# Patient Record
Sex: Female | Born: 1960 | Race: Black or African American | Hispanic: No | Marital: Married | Smoking: Never smoker
Health system: Southern US, Community
[De-identification: ages and names within clinical notes are randomized; demographics above are authoritative.]

## PROBLEM LIST (undated history)

## (undated) DIAGNOSIS — H269 Unspecified cataract: Secondary | ICD-10-CM

## (undated) DIAGNOSIS — F419 Anxiety disorder, unspecified: Secondary | ICD-10-CM

## (undated) DIAGNOSIS — C801 Malignant (primary) neoplasm, unspecified: Secondary | ICD-10-CM

## (undated) DIAGNOSIS — I10 Essential (primary) hypertension: Secondary | ICD-10-CM

## (undated) DIAGNOSIS — K219 Gastro-esophageal reflux disease without esophagitis: Secondary | ICD-10-CM

## (undated) DIAGNOSIS — T7840XA Allergy, unspecified, initial encounter: Secondary | ICD-10-CM

## (undated) DIAGNOSIS — Z8639 Personal history of other endocrine, nutritional and metabolic disease: Secondary | ICD-10-CM

## (undated) DIAGNOSIS — R7303 Prediabetes: Secondary | ICD-10-CM

## (undated) DIAGNOSIS — D649 Anemia, unspecified: Secondary | ICD-10-CM

## (undated) HISTORY — DX: Gastro-esophageal reflux disease without esophagitis: K21.9

## (undated) HISTORY — DX: Allergy, unspecified, initial encounter: T78.40XA

## (undated) HISTORY — DX: Anxiety disorder, unspecified: F41.9

## (undated) HISTORY — DX: Unspecified cataract: H26.9

## (undated) HISTORY — DX: Personal history of other endocrine, nutritional and metabolic disease: Z86.39

---

## 1998-05-17 ENCOUNTER — Emergency Department (HOSPITAL_COMMUNITY): Admission: EM | Admit: 1998-05-17 | Discharge: 1998-05-17 | Payer: Self-pay | Admitting: Emergency Medicine

## 1998-06-06 ENCOUNTER — Emergency Department (HOSPITAL_COMMUNITY): Admission: EM | Admit: 1998-06-06 | Discharge: 1998-06-06 | Payer: Self-pay | Admitting: Emergency Medicine

## 1998-06-08 ENCOUNTER — Other Ambulatory Visit: Admission: RE | Admit: 1998-06-08 | Discharge: 1998-06-08 | Payer: Self-pay | Admitting: *Deleted

## 1999-05-03 ENCOUNTER — Emergency Department (HOSPITAL_COMMUNITY): Admission: EM | Admit: 1999-05-03 | Discharge: 1999-05-03 | Payer: Self-pay | Admitting: Emergency Medicine

## 1999-07-18 ENCOUNTER — Ambulatory Visit (HOSPITAL_COMMUNITY): Admission: RE | Admit: 1999-07-18 | Discharge: 1999-07-18 | Payer: Self-pay | Admitting: Internal Medicine

## 2000-11-09 ENCOUNTER — Inpatient Hospital Stay (HOSPITAL_COMMUNITY): Admission: AD | Admit: 2000-11-09 | Discharge: 2000-11-09 | Payer: Self-pay | Admitting: Obstetrics & Gynecology

## 2001-03-27 ENCOUNTER — Ambulatory Visit (HOSPITAL_COMMUNITY): Admission: RE | Admit: 2001-03-27 | Discharge: 2001-03-27 | Payer: Self-pay

## 2001-05-02 ENCOUNTER — Encounter: Admission: RE | Admit: 2001-05-02 | Discharge: 2001-05-02 | Payer: Self-pay | Admitting: Hematology and Oncology

## 2001-06-11 ENCOUNTER — Emergency Department (HOSPITAL_COMMUNITY): Admission: EM | Admit: 2001-06-11 | Discharge: 2001-06-11 | Payer: Self-pay | Admitting: Emergency Medicine

## 2001-12-27 ENCOUNTER — Emergency Department (HOSPITAL_COMMUNITY): Admission: EM | Admit: 2001-12-27 | Discharge: 2001-12-28 | Payer: Self-pay | Admitting: Emergency Medicine

## 2001-12-28 ENCOUNTER — Emergency Department (HOSPITAL_COMMUNITY): Admission: EM | Admit: 2001-12-28 | Discharge: 2001-12-28 | Payer: Self-pay | Admitting: Emergency Medicine

## 2002-04-21 ENCOUNTER — Encounter: Admission: RE | Admit: 2002-04-21 | Discharge: 2002-04-21 | Payer: Self-pay

## 2002-12-03 ENCOUNTER — Encounter: Admission: RE | Admit: 2002-12-03 | Discharge: 2002-12-03 | Payer: Self-pay | Admitting: Internal Medicine

## 2002-12-09 ENCOUNTER — Ambulatory Visit (HOSPITAL_COMMUNITY): Admission: RE | Admit: 2002-12-09 | Discharge: 2002-12-09 | Payer: Self-pay | Admitting: Internal Medicine

## 2004-01-29 ENCOUNTER — Ambulatory Visit (HOSPITAL_COMMUNITY): Admission: RE | Admit: 2004-01-29 | Discharge: 2004-01-29 | Payer: Self-pay | Admitting: Internal Medicine

## 2004-04-17 ENCOUNTER — Emergency Department (HOSPITAL_COMMUNITY): Admission: EM | Admit: 2004-04-17 | Discharge: 2004-04-17 | Payer: Self-pay | Admitting: Emergency Medicine

## 2004-05-31 ENCOUNTER — Encounter: Admission: RE | Admit: 2004-05-31 | Discharge: 2004-05-31 | Payer: Self-pay | Admitting: Internal Medicine

## 2005-08-10 ENCOUNTER — Ambulatory Visit (HOSPITAL_COMMUNITY): Admission: RE | Admit: 2005-08-10 | Discharge: 2005-08-10 | Payer: Self-pay | Admitting: Internal Medicine

## 2005-08-14 ENCOUNTER — Ambulatory Visit: Payer: Self-pay | Admitting: Internal Medicine

## 2005-08-14 ENCOUNTER — Emergency Department (HOSPITAL_COMMUNITY): Admission: EM | Admit: 2005-08-14 | Discharge: 2005-08-14 | Payer: Self-pay | Admitting: Family Medicine

## 2005-10-26 ENCOUNTER — Ambulatory Visit: Payer: Self-pay | Admitting: Internal Medicine

## 2005-11-15 ENCOUNTER — Ambulatory Visit: Payer: Self-pay | Admitting: Internal Medicine

## 2005-12-06 ENCOUNTER — Ambulatory Visit: Payer: Self-pay | Admitting: Internal Medicine

## 2005-12-13 ENCOUNTER — Ambulatory Visit: Payer: Self-pay | Admitting: Internal Medicine

## 2006-07-04 ENCOUNTER — Ambulatory Visit: Payer: Self-pay | Admitting: Internal Medicine

## 2006-10-11 ENCOUNTER — Encounter (INDEPENDENT_AMBULATORY_CARE_PROVIDER_SITE_OTHER): Payer: Self-pay | Admitting: Internal Medicine

## 2006-10-11 ENCOUNTER — Ambulatory Visit: Payer: Self-pay | Admitting: Internal Medicine

## 2006-10-11 LAB — CONVERTED CEMR LAB
AST: 12 units/L (ref 0–37)
Albumin: 3.7 g/dL (ref 3.5–5.2)
Alkaline Phosphatase: 60 units/L (ref 39–117)
BUN: 11 mg/dL (ref 6–23)
CO2: 25 meq/L (ref 19–32)
Calcium: 9.1 mg/dL (ref 8.4–10.5)
Chloride: 107 meq/L (ref 96–112)
Creatinine, Ser: 0.88 mg/dL (ref 0.40–1.20)

## 2006-10-12 ENCOUNTER — Ambulatory Visit (HOSPITAL_COMMUNITY): Admission: RE | Admit: 2006-10-12 | Discharge: 2006-10-12 | Payer: Self-pay | Admitting: Internal Medicine

## 2006-11-21 ENCOUNTER — Ambulatory Visit: Payer: Self-pay | Admitting: Hospitalist

## 2007-06-25 ENCOUNTER — Ambulatory Visit: Payer: Self-pay | Admitting: Internal Medicine

## 2007-06-25 ENCOUNTER — Encounter (INDEPENDENT_AMBULATORY_CARE_PROVIDER_SITE_OTHER): Payer: Self-pay | Admitting: *Deleted

## 2007-06-25 DIAGNOSIS — I10 Essential (primary) hypertension: Secondary | ICD-10-CM

## 2007-06-25 DIAGNOSIS — K219 Gastro-esophageal reflux disease without esophagitis: Secondary | ICD-10-CM | POA: Insufficient documentation

## 2007-06-25 DIAGNOSIS — R609 Edema, unspecified: Secondary | ICD-10-CM | POA: Insufficient documentation

## 2007-06-25 DIAGNOSIS — E669 Obesity, unspecified: Secondary | ICD-10-CM

## 2007-06-25 DIAGNOSIS — K921 Melena: Secondary | ICD-10-CM | POA: Insufficient documentation

## 2007-06-25 DIAGNOSIS — L259 Unspecified contact dermatitis, unspecified cause: Secondary | ICD-10-CM

## 2007-06-25 HISTORY — DX: Melena: K92.1

## 2007-06-25 LAB — CONVERTED CEMR LAB
Albumin: 4 g/dL (ref 3.5–5.2)
Alkaline Phosphatase: 51 units/L (ref 39–117)
Chloride: 105 meq/L (ref 96–112)
Potassium: 3.9 meq/L (ref 3.5–5.3)
Sodium: 140 meq/L (ref 135–145)
TSH: 2.055 microintl units/mL (ref 0.350–5.50)
Total Protein: 7.3 g/dL (ref 6.0–8.3)

## 2007-07-05 ENCOUNTER — Ambulatory Visit: Payer: Self-pay | Admitting: Internal Medicine

## 2007-07-05 ENCOUNTER — Encounter (INDEPENDENT_AMBULATORY_CARE_PROVIDER_SITE_OTHER): Payer: Self-pay | Admitting: *Deleted

## 2007-07-05 LAB — CONVERTED CEMR LAB
HDL: 36 mg/dL — ABNORMAL LOW (ref >39)
Total CHOL/HDL Ratio: 4.8
VLDL: 27 mg/dL (ref 0–40)

## 2007-08-08 ENCOUNTER — Emergency Department (HOSPITAL_COMMUNITY): Admission: EM | Admit: 2007-08-08 | Discharge: 2007-08-08 | Payer: Self-pay | Admitting: Emergency Medicine

## 2007-10-15 ENCOUNTER — Ambulatory Visit (HOSPITAL_COMMUNITY): Admission: RE | Admit: 2007-10-15 | Discharge: 2007-10-15 | Payer: Self-pay | Admitting: Obstetrics & Gynecology

## 2007-11-05 ENCOUNTER — Ambulatory Visit: Payer: Self-pay | Admitting: Family Medicine

## 2007-11-05 ENCOUNTER — Encounter (INDEPENDENT_AMBULATORY_CARE_PROVIDER_SITE_OTHER): Payer: Self-pay | Admitting: Nurse Practitioner

## 2007-11-05 LAB — CONVERTED CEMR LAB
Alkaline Phosphatase: 58 units/L (ref 39–117)
BUN: 10 mg/dL (ref 6–23)
Basophils Absolute: 0 10*3/uL (ref 0.0–0.1)
Basophils Relative: 0 % (ref 0–1)
Calcium: 8.8 mg/dL (ref 8.4–10.5)
Chloride: 103 meq/L (ref 96–112)
Creatinine, Ser: 0.72 mg/dL (ref 0.40–1.20)
Eosinophils Relative: 10 % — ABNORMAL HIGH (ref 0–5)
Glucose, Bld: 101 mg/dL — ABNORMAL HIGH (ref 70–99)
HDL: 39 mg/dL — ABNORMAL LOW (ref >39)
LDL Cholesterol: 124 mg/dL — ABNORMAL HIGH (ref 0–99)
Lymphocytes Relative: 32 % (ref 12–46)
Lymphs Abs: 1.6 10*3/uL (ref 0.7–4.0)
MCV: 87.5 fL (ref 78.0–100.0)
Neutro Abs: 2.4 10*3/uL (ref 1.7–7.7)
Neutrophils Relative %: 51 % (ref 43–77)
Platelets: 352 10*3/uL (ref 150–400)
RDW: 14.9 % (ref 11.5–15.5)
Total Bilirubin: 0.3 mg/dL (ref 0.3–1.2)
Total CHOL/HDL Ratio: 5.1
Total Protein: 7.2 g/dL (ref 6.0–8.3)
WBC: 4.8 10*3/uL (ref 4.0–10.5)

## 2007-11-06 ENCOUNTER — Ambulatory Visit: Payer: Self-pay | Admitting: *Deleted

## 2007-12-04 ENCOUNTER — Encounter (INDEPENDENT_AMBULATORY_CARE_PROVIDER_SITE_OTHER): Payer: Self-pay | Admitting: Nurse Practitioner

## 2007-12-04 ENCOUNTER — Ambulatory Visit: Payer: Self-pay | Admitting: Family Medicine

## 2007-12-27 ENCOUNTER — Emergency Department (HOSPITAL_COMMUNITY): Admission: EM | Admit: 2007-12-27 | Discharge: 2007-12-27 | Payer: Self-pay | Admitting: Emergency Medicine

## 2008-02-12 ENCOUNTER — Ambulatory Visit: Payer: Self-pay | Admitting: Family Medicine

## 2008-08-15 ENCOUNTER — Emergency Department (HOSPITAL_COMMUNITY): Admission: EM | Admit: 2008-08-15 | Discharge: 2008-08-15 | Payer: Self-pay | Admitting: Emergency Medicine

## 2010-01-05 ENCOUNTER — Emergency Department (HOSPITAL_COMMUNITY): Admission: EM | Admit: 2010-01-05 | Discharge: 2010-01-05 | Payer: Self-pay | Admitting: Emergency Medicine

## 2010-08-02 ENCOUNTER — Emergency Department (HOSPITAL_COMMUNITY): Admission: EM | Admit: 2010-08-02 | Discharge: 2010-08-02 | Payer: Self-pay | Admitting: Family Medicine

## 2010-10-16 ENCOUNTER — Emergency Department (HOSPITAL_COMMUNITY): Admission: EM | Admit: 2010-10-16 | Discharge: 2010-10-16 | Payer: Self-pay | Admitting: Emergency Medicine

## 2010-12-01 ENCOUNTER — Emergency Department (HOSPITAL_COMMUNITY): Admission: EM | Admit: 2010-12-01 | Discharge: 2010-08-04 | Payer: Self-pay | Admitting: Emergency Medicine

## 2011-01-15 ENCOUNTER — Encounter: Payer: Self-pay | Admitting: Family Medicine

## 2011-02-01 ENCOUNTER — Other Ambulatory Visit: Payer: Self-pay

## 2011-03-11 LAB — POCT I-STAT, CHEM 8
BUN: 13 mg/dL (ref 6–23)
Creatinine, Ser: 0.6 mg/dL (ref 0.4–1.2)
Glucose, Bld: 95 mg/dL (ref 70–99)
HCT: 41 % (ref 36.0–46.0)
Sodium: 138 mEq/L (ref 135–145)

## 2011-03-31 ENCOUNTER — Other Ambulatory Visit (HOSPITAL_COMMUNITY): Payer: Self-pay | Admitting: Family Medicine

## 2011-03-31 DIAGNOSIS — Z1231 Encounter for screening mammogram for malignant neoplasm of breast: Secondary | ICD-10-CM

## 2011-04-05 ENCOUNTER — Ambulatory Visit (HOSPITAL_COMMUNITY)
Admission: RE | Admit: 2011-04-05 | Discharge: 2011-04-05 | Disposition: A | Discharge status: Home / Self Care | Payer: Self-pay | Source: Ambulatory Visit | Attending: Family Medicine | Admitting: Family Medicine

## 2011-04-05 DIAGNOSIS — Z1231 Encounter for screening mammogram for malignant neoplasm of breast: Secondary | ICD-10-CM

## 2011-04-06 ENCOUNTER — Other Ambulatory Visit: Payer: Self-pay | Admitting: Family Medicine

## 2011-04-06 DIAGNOSIS — R928 Other abnormal and inconclusive findings on diagnostic imaging of breast: Secondary | ICD-10-CM

## 2011-04-11 ENCOUNTER — Ambulatory Visit
Admission: RE | Admit: 2011-04-11 | Discharge: 2011-04-11 | Disposition: A | Discharge status: Home / Self Care | Payer: Self-pay | Source: Ambulatory Visit | Attending: Family Medicine | Admitting: Family Medicine

## 2011-04-11 DIAGNOSIS — R928 Other abnormal and inconclusive findings on diagnostic imaging of breast: Secondary | ICD-10-CM

## 2011-05-17 ENCOUNTER — Emergency Department (HOSPITAL_BASED_OUTPATIENT_CLINIC_OR_DEPARTMENT_OTHER)
Admission: EM | Admit: 2011-05-17 | Discharge: 2011-05-17 | Disposition: A | Discharge status: Home / Self Care | Payer: Self-pay | Attending: Emergency Medicine | Admitting: Emergency Medicine

## 2011-05-17 DIAGNOSIS — I10 Essential (primary) hypertension: Secondary | ICD-10-CM | POA: Insufficient documentation

## 2011-05-17 DIAGNOSIS — J029 Acute pharyngitis, unspecified: Secondary | ICD-10-CM | POA: Insufficient documentation

## 2011-11-22 ENCOUNTER — Encounter (HOSPITAL_BASED_OUTPATIENT_CLINIC_OR_DEPARTMENT_OTHER): Payer: Self-pay | Admitting: *Deleted

## 2011-11-22 ENCOUNTER — Emergency Department (HOSPITAL_BASED_OUTPATIENT_CLINIC_OR_DEPARTMENT_OTHER)
Admission: EM | Admit: 2011-11-22 | Discharge: 2011-11-23 | Disposition: A | Discharge status: Home / Self Care | Payer: Self-pay | Attending: Emergency Medicine | Admitting: Emergency Medicine

## 2011-11-22 ENCOUNTER — Emergency Department (INDEPENDENT_AMBULATORY_CARE_PROVIDER_SITE_OTHER): Payer: Self-pay

## 2011-11-22 DIAGNOSIS — J40 Bronchitis, not specified as acute or chronic: Secondary | ICD-10-CM | POA: Insufficient documentation

## 2011-11-22 DIAGNOSIS — J984 Other disorders of lung: Secondary | ICD-10-CM | POA: Insufficient documentation

## 2011-11-22 DIAGNOSIS — R911 Solitary pulmonary nodule: Secondary | ICD-10-CM

## 2011-11-22 DIAGNOSIS — I10 Essential (primary) hypertension: Secondary | ICD-10-CM | POA: Insufficient documentation

## 2011-11-22 DIAGNOSIS — R05 Cough: Secondary | ICD-10-CM

## 2011-11-22 DIAGNOSIS — R079 Chest pain, unspecified: Secondary | ICD-10-CM

## 2011-11-22 HISTORY — DX: Essential (primary) hypertension: I10

## 2011-11-22 MED ORDER — PREDNISONE 50 MG PO TABS
60.0000 mg | ORAL_TABLET | Freq: Once | ORAL | Status: AC
  Administered 2011-11-22: 60 mg via ORAL
  Filled 2011-11-22: qty 1

## 2011-11-22 MED ORDER — ALBUTEROL SULFATE (5 MG/ML) 0.5% IN NEBU
2.5000 mg | INHALATION_SOLUTION | Freq: Once | RESPIRATORY_TRACT | Status: AC
  Administered 2011-11-22: 2.5 mg via RESPIRATORY_TRACT
  Filled 2011-11-22: qty 0.5

## 2011-11-22 MED ORDER — IPRATROPIUM BROMIDE 0.02 % IN SOLN
0.5000 mg | Freq: Once | RESPIRATORY_TRACT | Status: AC
  Administered 2011-11-22: 0.5 mg via RESPIRATORY_TRACT
  Filled 2011-11-22: qty 2.5

## 2011-11-22 MED ORDER — PREDNISONE 10 MG PO TABS: 20.0000 mg | ORAL_TABLET | Freq: Every day | ORAL | Status: AC

## 2011-11-22 MED ORDER — ALBUTEROL SULFATE HFA 108 (90 BASE) MCG/ACT IN AERS
2.0000 | INHALATION_SPRAY | RESPIRATORY_TRACT | Status: DC | PRN
  Administered 2011-11-22: 2 via RESPIRATORY_TRACT
  Filled 2011-11-22: qty 6.7

## 2011-11-22 NOTE — ED Notes (Signed)
Pt c/o cough productive of light brown sputum x1 week.

## 2011-11-22 NOTE — ED Provider Notes (Signed)
History     CSN: [id number] Arrival date & time: 11/22/2011  9:36 PM   First MD Initiated Contact with Patient 11/22/11 2309      Chief Complaint  Patient presents with  . Cough    (Consider location/radiation/quality/duration/timing/severity/associated sxs/prior treatment) Patient is a 50 y.o. female presenting with cough. The history is provided by the patient.  Cough   cough has been present for one week and is productive of some light tan sputum. There is some associated dyspnea. She denies any chest pain, heaviness, tightness, or pressure. She denies fever, chills, sweats. She denies rhinorrhea, sore throat, arthralgias, myalgias, diarrhea. She has tried over-the-counter measures such as NyQuil with no relief. Nothing makes her cough better nothing makes it worse. Symptoms are described as moderate. Her daughter states that cough seems to be worse when she gets out in cold air.  Past Medical History  Diagnosis Date  . Hypertension     Past Surgical History  Procedure Date  . Cesarean section     No family history on file.  History  Substance Use Topics  . Smoking status: Never Smoker   . Smokeless tobacco: Not on file  . Alcohol Use: No    OB History    Grav Para Term Preterm Abortions TAB SAB Ect Mult Living                  Review of Systems  Respiratory: Positive for cough.   All other systems reviewed and are negative.    Allergies  Penicillins  Home Medications   Current Outpatient Rx  Name Route Sig Dispense Refill  . AMLODIPINE BESYLATE 10 MG PO TABS Oral Take 10 mg by mouth daily.      [name] DIPHENHYDRAMINE-APAP (SLEEP) 25-500 MG PO TABS Oral Take 2 tablets by mouth at bedtime as needed. For sleep     . LISINOPRIL 40 MG PO TABS Oral Take 40 mg by mouth daily.      [name] ONE-DAILY MULTI VITAMINS PO TABS Oral Take 1 tablet by mouth daily.      [name] PRESCRIPTION MEDICATION Oral Take 1 tablet by mouth daily. Unknown blood pressure medication       BP  183/89  Pulse 66  Temp(Src) 98.6 F (37 C) (Oral)  Resp 20  SpO2 100%  LMP 02/24/2011  Physical Exam  Nursing note and vitals reviewed.  50 year old female resting comfortably and in no acute distress. Blood pressure is 183/8089. Head is normocephalic and atraumatic. PERRLA, EOMI. There's no sinus tenderness. Oropharynx is clear. Neck is supple without adenopathy or JVD. Back is nontender to CVA tenderness. Lungs have a slightly prolonged exhalation phase but no overt rales, wheezes, or rhonchi. Heart has regular rate and rhythm without murmur. Abdomen is soft, flat, nontender without masses or hepatosplenomegaly. Extremities have 1+ edema, no cyanosis. Skin is warm and dry without rash. Full range of motion is present. Neurologic: Mental status is normal, cranial nerves are intact, there are no motor or sensory deficits. Psychiatric: No abnormalities of mood or affect.  ED Course  Procedures (including critical care time)  Labs Reviewed - No data to display Dg Chest 2 View  11/22/2011  *RADIOLOGY REPORT*  Clinical Data: Productive cough.  CHEST - 2 VIEW  Comparison: 08/08/2007  Findings: Two views of the chest were obtained. There is a nodular density near the anterior left first rib which is asymmetric.  This could be related to the bone but indeterminate.  Heart size is within  normal limits.  No evidence for edema or airspace disease. No evidence of pleural effusions.  IMPRESSION:  Question a nodular density in the left upper lung.  Recommend further evaluation with a chest CT to exclude a parenchymal nodule.  These results were called by telephone on 11/22/2011  at  8:59 p.m. to  Dr. Ignacia Palma, who verbally acknowledged these results.  Original Report Authenticated By: Richarda Overlie, M.D.     No diagnosis found.   Reexamination at 11:45 PM: There has been significant improvement after an albuterol nebulizer treatment. Lungs are clear there is no prolonged exhalation phase. She feels a  tightness in her chest is significantly improved. MDM  Episode of bronchitis        Dione Booze, MD 11/22/11 2350

## 2012-01-17 ENCOUNTER — Ambulatory Visit (HOSPITAL_COMMUNITY)
Admission: RE | Admit: 2012-01-17 | Discharge: 2012-01-17 | Disposition: A | Discharge status: Home / Self Care | Payer: Self-pay | Source: Ambulatory Visit | Attending: Internal Medicine | Admitting: Internal Medicine

## 2012-01-17 ENCOUNTER — Other Ambulatory Visit: Payer: Self-pay | Admitting: Internal Medicine

## 2012-01-17 DIAGNOSIS — I10 Essential (primary) hypertension: Secondary | ICD-10-CM | POA: Insufficient documentation

## 2012-01-17 DIAGNOSIS — J988 Other specified respiratory disorders: Secondary | ICD-10-CM | POA: Insufficient documentation

## 2012-01-22 ENCOUNTER — Other Ambulatory Visit (HOSPITAL_COMMUNITY): Payer: Self-pay | Admitting: Internal Medicine

## 2012-01-22 DIAGNOSIS — R911 Solitary pulmonary nodule: Secondary | ICD-10-CM

## 2012-01-25 ENCOUNTER — Encounter (HOSPITAL_COMMUNITY): Payer: Self-pay

## 2012-01-25 ENCOUNTER — Ambulatory Visit (HOSPITAL_COMMUNITY)
Admission: RE | Admit: 2012-01-25 | Discharge: 2012-01-25 | Disposition: A | Discharge status: Home / Self Care | Payer: Self-pay | Source: Ambulatory Visit | Attending: Internal Medicine | Admitting: Internal Medicine

## 2012-01-25 DIAGNOSIS — R911 Solitary pulmonary nodule: Secondary | ICD-10-CM | POA: Insufficient documentation

## 2012-01-25 MED ORDER — IOHEXOL 300 MG/ML  SOLN: 80.0000 mL | Freq: Once | INTRAMUSCULAR | Status: AC | PRN

## 2012-08-06 ENCOUNTER — Other Ambulatory Visit (HOSPITAL_COMMUNITY): Payer: Self-pay | Admitting: Internal Medicine

## 2012-10-14 ENCOUNTER — Other Ambulatory Visit (HOSPITAL_COMMUNITY): Payer: Self-pay | Admitting: Internal Medicine

## 2012-10-14 DIAGNOSIS — Z1231 Encounter for screening mammogram for malignant neoplasm of breast: Secondary | ICD-10-CM

## 2012-10-31 ENCOUNTER — Ambulatory Visit (HOSPITAL_COMMUNITY)
Admission: RE | Admit: 2012-10-31 | Discharge: 2012-10-31 | Disposition: A | Discharge status: Home / Self Care | Payer: Self-pay | Source: Ambulatory Visit | Attending: Internal Medicine | Admitting: Internal Medicine

## 2012-10-31 DIAGNOSIS — Z1231 Encounter for screening mammogram for malignant neoplasm of breast: Secondary | ICD-10-CM

## 2013-04-25 ENCOUNTER — Other Ambulatory Visit (HOSPITAL_COMMUNITY): Payer: Self-pay | Admitting: Internal Medicine

## 2013-04-25 DIAGNOSIS — Z1231 Encounter for screening mammogram for malignant neoplasm of breast: Secondary | ICD-10-CM

## 2013-05-03 ENCOUNTER — Emergency Department (HOSPITAL_BASED_OUTPATIENT_CLINIC_OR_DEPARTMENT_OTHER)
Admission: EM | Admit: 2013-05-03 | Discharge: 2013-05-03 | Disposition: A | Discharge status: Home / Self Care | Payer: No Typology Code available for payment source | Attending: Emergency Medicine | Admitting: Emergency Medicine

## 2013-05-03 ENCOUNTER — Encounter (HOSPITAL_BASED_OUTPATIENT_CLINIC_OR_DEPARTMENT_OTHER): Payer: Self-pay

## 2013-05-03 DIAGNOSIS — H9319 Tinnitus, unspecified ear: Secondary | ICD-10-CM | POA: Insufficient documentation

## 2013-05-03 DIAGNOSIS — R110 Nausea: Secondary | ICD-10-CM | POA: Insufficient documentation

## 2013-05-03 DIAGNOSIS — R51 Headache: Secondary | ICD-10-CM | POA: Insufficient documentation

## 2013-05-03 DIAGNOSIS — Z79899 Other long term (current) drug therapy: Secondary | ICD-10-CM | POA: Insufficient documentation

## 2013-05-03 DIAGNOSIS — R42 Dizziness and giddiness: Secondary | ICD-10-CM | POA: Insufficient documentation

## 2013-05-03 DIAGNOSIS — I10 Essential (primary) hypertension: Secondary | ICD-10-CM | POA: Insufficient documentation

## 2013-05-03 DIAGNOSIS — R5383 Other fatigue: Secondary | ICD-10-CM | POA: Insufficient documentation

## 2013-05-03 DIAGNOSIS — Z880 Allergy status to penicillin: Secondary | ICD-10-CM | POA: Insufficient documentation

## 2013-05-03 DIAGNOSIS — R5381 Other malaise: Secondary | ICD-10-CM | POA: Insufficient documentation

## 2013-05-03 DIAGNOSIS — R531 Weakness: Secondary | ICD-10-CM

## 2013-05-03 LAB — BASIC METABOLIC PANEL
CO2: 29 mEq/L (ref 19–32)
Chloride: 102 mEq/L (ref 96–112)
Glucose, Bld: 98 mg/dL (ref 70–99)
Potassium: 4 mEq/L (ref 3.5–5.1)
Sodium: 141 mEq/L (ref 135–145)

## 2013-05-03 LAB — URINALYSIS, ROUTINE W REFLEX MICROSCOPIC
Glucose, UA: NEGATIVE mg/dL
Ketones, ur: NEGATIVE mg/dL
Specific Gravity, Urine: 1.016 (ref 1.005–1.030)
pH: 7.5 (ref 5.0–8.0)

## 2013-05-03 LAB — CBC WITH DIFFERENTIAL/PLATELET
Eosinophils Absolute: 0.5 10*3/uL (ref 0.0–0.7)
Eosinophils Relative: 9 % — ABNORMAL HIGH (ref 0–5)
Hemoglobin: 13 g/dL (ref 12.0–15.0)
Lymphs Abs: 1.7 10*3/uL (ref 0.7–4.0)
MCH: 29.4 pg (ref 26.0–34.0)
MCV: 86.7 fL (ref 78.0–100.0)
Monocytes Relative: 9 % (ref 3–12)
Neutrophils Relative %: 53 % (ref 43–77)
RBC: 4.42 MIL/uL (ref 3.87–5.11)

## 2013-05-03 LAB — URINE MICROSCOPIC-ADD ON

## 2013-05-03 MED ORDER — IBUPROFEN 600 MG PO TABS: 600.0000 mg | ORAL_TABLET | Freq: Four times a day (QID) | ORAL | Status: DC | PRN

## 2013-05-03 MED ORDER — SODIUM CHLORIDE 0.9 % IV BOLUS (SEPSIS)
1000.0000 mL | Freq: Once | INTRAVENOUS | Status: AC
  Administered 2013-05-03: 1000 mL via INTRAVENOUS

## 2013-05-03 MED ORDER — KETOROLAC TROMETHAMINE 30 MG/ML IJ SOLN
30.0000 mg | Freq: Once | INTRAMUSCULAR | Status: AC
  Administered 2013-05-03: 30 mg via INTRAVENOUS
  Filled 2013-05-03: qty 1

## 2013-05-03 MED ORDER — ONDANSETRON 8 MG PO TBDP: 8.0000 mg | ORAL_TABLET | Freq: Three times a day (TID) | ORAL | Status: DC | PRN

## 2013-05-03 MED ORDER — METOCLOPRAMIDE HCL 5 MG/ML IJ SOLN
10.0000 mg | Freq: Once | INTRAMUSCULAR | Status: AC
  Administered 2013-05-03: 10 mg via INTRAVENOUS
  Filled 2013-05-03: qty 2

## 2013-05-03 NOTE — ED Provider Notes (Addendum)
History    This chart was scribed for Derwood Kaplan, MD by Leone Payor, ED Scribe. This patient was seen in room MH05/MH05 and the patient's care was started 4:05 PM.   CSN: [id number]  Arrival date & time 05/03/13  1520   First MD Initiated Contact with Patient 05/03/13 1555      Chief Complaint  Patient presents with  . Headache  . Dizziness     The history is provided by the patient. No language interpreter was used.    HPI Comments: Sheila Carlson is a 52 y.o. female who presents to the Emergency Department complaining of ongoing, intermittent, L sided HAs starting 1 week ago. Has had it for 4 days out of the week that last for 15-20 minutes at a time. She has them as often as 3 times a day. She describes the pain as throbbing. She also some generalized weakness but denies any recent illness, or sick contacts. She also has some intermittent lightheadedness that occur while standing and laying down. She believes the lightheadedness is not associated with the HAs. She has associated nausea, tinnitus. She denies LOC, confusion, vomiting, fever, chills, diarrhea. She has not tried anything for her symptoms.  Pt has h/o  HTN. Denies chest pain, dib, cardiac hx.   Past Medical History  Diagnosis Date  . Hypertension     Past Surgical History  Procedure Laterality Date  . Cesarean section      History reviewed. No pertinent family history.  History  Substance Use Topics  . Smoking status: Never Smoker   . Smokeless tobacco: Never Used  . Alcohol Use: No    OB History   Grav Para Term Preterm Abortions TAB SAB Ect Mult Living                  Review of Systems A complete 10 system review of systems was obtained and all systems are negative except as noted in the HPI and PMH.   Allergies  Penicillins  Home Medications   Current Outpatient Rx  Name  Route  Sig  Dispense  Refill  . amLODipine (NORVASC) 10 MG tablet   Oral   Take 10 mg by mouth daily.            . hydrochlorothiazide (HYDRODIURIL) 25 MG tablet   Oral   Take 25 mg by mouth daily.         [name] lisinopril (PRINIVIL,ZESTRIL) 40 MG tablet   Oral   Take 40 mg by mouth daily.           . phentermine 37.5 MG capsule   Oral   Take 37.5 mg by mouth every morning.         . diphenhydramine-acetaminophen (TYLENOL PM) 25-500 MG TABS   Oral   Take 2 tablets by mouth at bedtime as needed. For sleep          . Multiple Vitamin (MULTIVITAMIN) tablet   Oral   Take 1 tablet by mouth daily.           [name] PRESCRIPTION MEDICATION   Oral   Take 1 tablet by mouth daily. Unknown blood pressure medication            BP 139/89  Pulse 85  Temp(Src) 98.1 F (36.7 C) (Oral)  Resp 16  Ht 5\' 6"  (1.676 m)  Wt 260 lb (117.935 kg)  BMI 41.99 kg/m2  SpO2 100%  LMP 03/25/2013  Physical Exam  Nursing note and  vitals reviewed. Constitutional: She is oriented to person, place, and time. She appears well-developed and well-nourished. No distress.  HENT:  Head: Normocephalic and atraumatic.  Eyes: EOM are normal.  Pupils are 4 mm equal and reactive to light. No nystagmus.  Neck: Neck supple. No tracheal deviation present.  No nuchal rigidity.   Cardiovascular: Normal rate, regular rhythm and normal heart sounds.   Pulmonary/Chest: Effort normal and breath sounds normal. No respiratory distress.  Musculoskeletal: Normal range of motion.  No pitting edema.   Neurological: She is alert and oriented to person, place, and time.  Cranial nerve 2-12 intact.   Skin: Skin is warm and dry.  Psychiatric: She has a normal mood and affect. Her behavior is normal.    ED Course  Procedures (including critical care time)  DIAGNOSTIC STUDIES: Oxygen Saturation is 100% on room air, normal by my interpretation.    COORDINATION OF CARE: 3:57 PM Discussed treatment plan with pt at bedside and pt agreed to plan.   Labs Reviewed - No data to display No results found.   No diagnosis  found.    MDM  I personally performed the services described in this documentation, which was scribed in my presence. The recorded information has been reviewed and is accurate.  Pt comes in with cc of intermittent headaches x 1 week, unilateral throbbing, lasting 30 minutes - and some generalized weakness, and dizziness, which is unrelated to the headaches.  The constellation of hx appear t obe unrelated. Will check basic labs and make sure there is no elyte abn. Will also get troponin.  There is no underlying infection per hx and exam. No concerns for life threatening secondary headaches.   Derwood Kaplan, MD 05/03/13 1642   Date: 05/03/2013  Rate: 69  Rhythm: normal sinus rhythm  QRS Axis: normal  Intervals: normal  ST/T Wave abnormalities: normal  Conduction Disutrbances: none  Narrative Interpretation: unremarkable      Derwood Kaplan, MD 05/03/13 1645

## 2013-05-03 NOTE — ED Notes (Signed)
C/o intermittent headaches and dizziness x 1 week

## 2013-05-03 NOTE — ED Notes (Addendum)
Pt states that for the past week she has had severe headache and intermittently dizziness.  Pt states that she has tried nothing at home for her symptoms.  Pt states that she has been taking phentermine to help her with weight loss.

## 2013-05-05 LAB — URINE CULTURE

## 2013-11-03 ENCOUNTER — Other Ambulatory Visit (HOSPITAL_COMMUNITY): Payer: Self-pay | Admitting: Internal Medicine

## 2013-11-03 ENCOUNTER — Ambulatory Visit (HOSPITAL_COMMUNITY)
Admission: RE | Admit: 2013-11-03 | Discharge: 2013-11-03 | Disposition: A | Discharge status: Home / Self Care | Payer: No Typology Code available for payment source | Source: Ambulatory Visit | Attending: Internal Medicine | Admitting: Internal Medicine

## 2013-11-03 DIAGNOSIS — Z1231 Encounter for screening mammogram for malignant neoplasm of breast: Secondary | ICD-10-CM

## 2013-12-02 ENCOUNTER — Emergency Department (HOSPITAL_BASED_OUTPATIENT_CLINIC_OR_DEPARTMENT_OTHER)
Admission: EM | Admit: 2013-12-02 | Discharge: 2013-12-03 | Disposition: A | Discharge status: Home / Self Care | Payer: No Typology Code available for payment source | Attending: Emergency Medicine | Admitting: Emergency Medicine

## 2013-12-02 ENCOUNTER — Encounter (HOSPITAL_BASED_OUTPATIENT_CLINIC_OR_DEPARTMENT_OTHER): Payer: Self-pay | Admitting: Emergency Medicine

## 2013-12-02 DIAGNOSIS — Z79899 Other long term (current) drug therapy: Secondary | ICD-10-CM | POA: Insufficient documentation

## 2013-12-02 DIAGNOSIS — Z880 Allergy status to penicillin: Secondary | ICD-10-CM | POA: Insufficient documentation

## 2013-12-02 DIAGNOSIS — R51 Headache: Secondary | ICD-10-CM | POA: Insufficient documentation

## 2013-12-02 DIAGNOSIS — H538 Other visual disturbances: Secondary | ICD-10-CM | POA: Insufficient documentation

## 2013-12-02 DIAGNOSIS — R519 Headache, unspecified: Secondary | ICD-10-CM

## 2013-12-02 DIAGNOSIS — I10 Essential (primary) hypertension: Secondary | ICD-10-CM

## 2013-12-02 LAB — CBC WITH DIFFERENTIAL/PLATELET
Eosinophils Relative: 7 % — ABNORMAL HIGH (ref 0–5)
HCT: 40.9 % (ref 36.0–46.0)
Hemoglobin: 13.4 g/dL (ref 12.0–15.0)
Lymphocytes Relative: 32 % (ref 12–46)
Lymphs Abs: 1.9 10*3/uL (ref 0.7–4.0)
MCV: 88 fL (ref 78.0–100.0)
Monocytes Absolute: 0.5 10*3/uL (ref 0.1–1.0)
Monocytes Relative: 8 % (ref 3–12)
Neutro Abs: 3.2 10*3/uL (ref 1.7–7.7)
RBC: 4.65 MIL/uL (ref 3.87–5.11)
RDW: 14.2 % (ref 11.5–15.5)
WBC: 6.1 10*3/uL (ref 4.0–10.5)

## 2013-12-02 LAB — BASIC METABOLIC PANEL
BUN: 14 mg/dL (ref 6–23)
CO2: 31 mEq/L (ref 19–32)
Calcium: 9.5 mg/dL (ref 8.4–10.5)
Chloride: 101 mEq/L (ref 96–112)
Creatinine, Ser: 0.8 mg/dL (ref 0.50–1.10)
Glucose, Bld: 102 mg/dL — ABNORMAL HIGH (ref 70–99)
Potassium: 3.2 mEq/L — ABNORMAL LOW (ref 3.5–5.1)

## 2013-12-02 MED ORDER — IBUPROFEN 400 MG PO TABS
600.0000 mg | ORAL_TABLET | Freq: Once | ORAL | Status: AC
  Administered 2013-12-02: 600 mg via ORAL
  Filled 2013-12-02 (×2): qty 1

## 2013-12-02 MED ORDER — CLONIDINE HCL 0.1 MG PO TABS
0.2000 mg | ORAL_TABLET | Freq: Once | ORAL | Status: AC
  Administered 2013-12-02: 0.2 mg via ORAL
  Filled 2013-12-02: qty 2

## 2013-12-02 MED ORDER — CLONIDINE HCL 0.1 MG PO TABS: ORAL_TABLET | ORAL | Status: DC

## 2013-12-02 NOTE — ED Provider Notes (Signed)
CSN: [id number]     Arrival date & time 12/02/13  2134 History   First MD Initiated Contact with Patient 12/02/13 2230     This chart was scribed for Geoffery Lyons, MD by Manuela Schwartz, ED scribe. This patient was seen in room MH05/MH05 and the patient's care was started at 2230.  Chief Complaint  Patient presents with  . Headache   Patient is a 52 y.o. female presenting with headaches. The history is provided by the patient. No language interpreter was used.  Headache Pain location:  Generalized Radiates to:  Does not radiate Onset quality:  Gradual Duration:  1 day Timing:  Constant Progression:  Unchanged Chronicity:  New Similar to prior headaches: yes   Context: not coughing   Relieved by:  Nothing Worsened by:  Nothing tried Ineffective treatments:  None tried Associated symptoms: blurred vision   Associated symptoms: no abdominal pain, no back pain, no congestion, no cough, no diarrhea, no fever, no nausea and no vomiting    HPI Comments: Sheila Carlson is a 52 y.o. female who presents to the Emergency Department complaining of headache and elevated blood pressure associated w/recently beginning her BP medicines since she off of them for x23 weeks b/c she was unable to previously afford them. She is on lisinopril and HCTZ. She reports usually gets HA when her BP is elevated. She checked her BP at home: 159/98 yesterday and 171/101 today. She does report blurred vision since yesterday.  She took a dose of BP medicine today and yesterday. Took no medicines for her HA. She denies recent illness or head trauma.   Past Medical History  Diagnosis Date  . Hypertension    Past Surgical History  Procedure Laterality Date  . Cesarean section     No family history on file. History  Substance Use Topics  . Smoking status: Never Smoker   . Smokeless tobacco: Never Used  . Alcohol Use: No   OB History   Grav Para Term Preterm Abortions TAB SAB Ect Mult Living                  Review of Systems  Constitutional: Negative for fever and chills.  HENT: Negative for congestion and rhinorrhea.   Eyes: Positive for blurred vision and visual disturbance.  Respiratory: Negative for cough and shortness of breath.   Cardiovascular: Negative for chest pain.  Gastrointestinal: Negative for nausea, vomiting, abdominal pain and diarrhea.  Musculoskeletal: Negative for back pain.  Skin: Negative for color change and rash.  Neurological: Positive for headaches. Negative for syncope.  All other systems reviewed and are negative.   A complete 10 system review of systems was obtained and all systems are negative except as noted in the HPI and PMH.   Allergies  Penicillins  Home Medications   Current Outpatient Rx  Name  Route  Sig  Dispense  Refill  . amLODipine (NORVASC) 10 MG tablet   Oral   Take 10 mg by mouth daily.           . diphenhydramine-acetaminophen (TYLENOL PM) 25-500 MG TABS   Oral   Take 2 tablets by mouth at bedtime as needed. For sleep          . hydrochlorothiazide (HYDRODIURIL) 25 MG tablet   Oral   Take 25 mg by mouth daily.         [name] ibuprofen (ADVIL,MOTRIN) 600 MG tablet   Oral   Take 1 tablet (600 mg total) by  mouth every 6 (six) hours as needed for pain.   30 tablet   0   . lisinopril (PRINIVIL,ZESTRIL) 40 MG tablet   Oral   Take 40 mg by mouth daily.           . Multiple Vitamin (MULTIVITAMIN) tablet   Oral   Take 1 tablet by mouth daily.           . ondansetron (ZOFRAN ODT) 8 MG disintegrating tablet   Oral   Take 1 tablet (8 mg total) by mouth every 8 (eight) hours as needed for nausea.   20 tablet   0   . phentermine 37.5 MG capsule   Oral   Take 37.5 mg by mouth every morning.         [name] PRESCRIPTION MEDICATION   Oral   Take 1 tablet by mouth daily. Unknown blood pressure medication           Triage Vitals: BP 174/99  Pulse 78  Temp(Src) 98.2 F (36.8 C) (Oral)  Resp 20  Ht 5\' 6"  (1.676 m)  Wt  280 lb (127.007 kg)  BMI 45.21 kg/m2  SpO2 100% Physical Exam  Nursing note and vitals reviewed. Constitutional: She is oriented to person, place, and time. She appears well-developed and well-nourished. No distress.  HENT:  Head: Normocephalic and atraumatic.  Eyes: Conjunctivae are normal. Right eye exhibits no discharge. Left eye exhibits no discharge.  No papilla edema   Neck: Normal range of motion. No tracheal deviation present.  Cardiovascular: Normal rate.   Pulmonary/Chest: Effort normal. No respiratory distress.  Musculoskeletal: Normal range of motion. She exhibits no edema.  Neurological: She is alert and oriented to person, place, and time.  Skin: Skin is warm and dry.  Psychiatric: She has a normal mood and affect. Thought content normal.    ED Course  Procedures (including critical care time) DIAGNOSTIC STUDIES: Oxygen Saturation is 100% on room air, normal by my interpretation.    COORDINATION OF CARE: At 1000 PM Discussed treatment plan with patient which includes clonidine, blood work. Patient agrees.   Labs Review Labs Reviewed  CBC WITH DIFFERENTIAL  BASIC METABOLIC PANEL   Imaging Review No results found.    MDM  No diagnosis found. Patient is a 52 year old female presents with complaints of headache And elevated blood pressure. She's been out of her blood pressure medication recently. She started back up yesterday however continues to have an elevated blood pressure. She was given clonidine in the ER along with ibuprofen and is feeling better. Laboratory studies reveal normal renal function. Her blood pressure is now 159/83 and I believe is stable for discharge. I will prescribe clonidine for her to take for the next few days until her blood pressure medications equilibrate.     I personally performed the services described in this documentation, which was scribed in my presence. The recorded information has been reviewed and is  accurate.      Geoffery Lyons, MD 12/02/13 [phone]

## 2013-12-02 NOTE — ED Notes (Signed)
Headache. Blurred vision. She ran out of BP medication for 2 weeks. Was able to take a dose yesterday and one today.

## 2014-01-20 ENCOUNTER — Encounter (HOSPITAL_BASED_OUTPATIENT_CLINIC_OR_DEPARTMENT_OTHER): Payer: Self-pay | Admitting: Emergency Medicine

## 2014-01-20 ENCOUNTER — Emergency Department (HOSPITAL_BASED_OUTPATIENT_CLINIC_OR_DEPARTMENT_OTHER)
Admission: EM | Admit: 2014-01-20 | Discharge: 2014-01-20 | Disposition: A | Discharge status: Home / Self Care | Payer: No Typology Code available for payment source | Attending: Emergency Medicine | Admitting: Emergency Medicine

## 2014-01-20 DIAGNOSIS — I10 Essential (primary) hypertension: Secondary | ICD-10-CM | POA: Insufficient documentation

## 2014-01-20 DIAGNOSIS — Z79899 Other long term (current) drug therapy: Secondary | ICD-10-CM | POA: Insufficient documentation

## 2014-01-20 DIAGNOSIS — Z880 Allergy status to penicillin: Secondary | ICD-10-CM | POA: Insufficient documentation

## 2014-01-20 DIAGNOSIS — K645 Perianal venous thrombosis: Secondary | ICD-10-CM | POA: Insufficient documentation

## 2014-01-20 MED ORDER — HYDROCODONE-ACETAMINOPHEN 5-325 MG PO TABS: 1.0000 | ORAL_TABLET | Freq: Four times a day (QID) | ORAL | Status: DC | PRN

## 2014-01-20 MED ORDER — DOCUSATE SODIUM 100 MG PO CAPS: 100.0000 mg | ORAL_CAPSULE | Freq: Two times a day (BID) | ORAL | Status: DC

## 2014-01-20 MED ORDER — HYDROCORTISONE ACETATE 25 MG RE SUPP: 25.0000 mg | Freq: Two times a day (BID) | RECTAL | Status: DC

## 2014-01-20 NOTE — ED Provider Notes (Signed)
CSN: [id number]     Arrival date & time 01/20/14  2035 History  This chart was scribed for Osvaldo Shipper, MD,  by Stacy Gardner, ED Scribe. The patient was seen in room MH09/MH09 and the patient's care was started at 11:37 PM.    First MD Initiated Contact with Patient 01/20/14 2257     Chief Complaint  Patient presents with  . Hemorrhoids   (Consider location/radiation/quality/duration/timing/severity/associated sxs/prior Treatment) Patient is a 53 y.o. female presenting with hematochezia. The history is provided by the patient and medical records. No language interpreter was used.  Rectal Bleeding Quality:  Bright red Amount:  Scant Duration:  3 days Timing:  Constant Progression:  Unchanged Chronicity:  Recurrent Context: not rectal pain   Similar prior episodes: yes   Relieved by:  Nothing Worsened by:  Nothing tried Ineffective treatments:  Wipes, hemorrhoid cream, time and sitz baths Associated symptoms: no abdominal pain, no light-headedness and no vomiting    HPI Comments: Sheila Carlson is a 53 y.o. female who presents to the Emergency Department complaining of bleeding hemorrhoids for the past three days after stilling on hard church pews.  Denies nausea, vomiting, diarrhea and abdominal pain.  She has the associated symptom of rectal bleeding. She notices blood stain on her underwear. Denies pain with bowel movement. Pt has tried Preparation H to no relief. She has a past hx of hemorrhoids which she mentions they were never this severe. Pt has a past medical hx of HTN.  Past Medical History  Diagnosis Date  . Hypertension    Past Surgical History  Procedure Laterality Date  . Cesarean section     No family history on file. History  Substance Use Topics  . Smoking status: Never Smoker   . Smokeless tobacco: Never Used  . Alcohol Use: No   OB History   Grav Para Term Preterm Abortions TAB SAB Ect Mult Living                 Review of Systems   Gastrointestinal: Positive for hematochezia, anal bleeding and rectal pain. Negative for nausea, vomiting, abdominal pain and diarrhea.  Neurological: Negative for light-headedness.  All other systems reviewed and are negative.    Allergies  Penicillins  Home Medications   Current Outpatient Rx  Name  Route  Sig  Dispense  Refill  . amLODipine (NORVASC) 10 MG tablet   Oral   Take 10 mg by mouth daily.           . cloNIDine (CATAPRES) 0.1 MG tablet      1 tab po tid x 2 days, then bid x 2 days, then once daily x 2 days   12 tablet   0   . diphenhydramine-acetaminophen (TYLENOL PM) 25-500 MG TABS   Oral   Take 2 tablets by mouth at bedtime as needed. For sleep          . hydrochlorothiazide (HYDRODIURIL) 25 MG tablet   Oral   Take 25 mg by mouth daily.         [name] ibuprofen (ADVIL,MOTRIN) 600 MG tablet   Oral   Take 1 tablet (600 mg total) by mouth every 6 (six) hours as needed for pain.   30 tablet   0   . lisinopril (PRINIVIL,ZESTRIL) 40 MG tablet   Oral   Take 40 mg by mouth daily.           . Multiple Vitamin (MULTIVITAMIN) tablet   Oral  Take 1 tablet by mouth daily.           . ondansetron (ZOFRAN ODT) 8 MG disintegrating tablet   Oral   Take 1 tablet (8 mg total) by mouth every 8 (eight) hours as needed for nausea.   20 tablet   0   . phentermine 37.5 MG capsule   Oral   Take 37.5 mg by mouth every morning.         [name] PRESCRIPTION MEDICATION   Oral   Take 1 tablet by mouth daily. Unknown blood pressure medication           BP 154/86  Pulse 83  Temp(Src) 98.7 F (37.1 C) (Oral)  Resp 16  Ht 5\' 6"  (1.676 m)  Wt 280 lb (127.007 kg)  BMI 45.21 kg/m2  SpO2 100% Physical Exam  Nursing note and vitals reviewed. Constitutional: She is oriented to person, place, and time. She appears well-developed and well-nourished. No distress.  HENT:  Head: Normocephalic and atraumatic.  Eyes: EOM are normal.  Neck: Neck supple. No tracheal  deviation present.  Cardiovascular: Normal rate.   Pulmonary/Chest: Effort normal. No respiratory distress.  Abdominal: Soft. She exhibits no mass. There is no tenderness.  Genitourinary: Rectal exam shows external hemorrhoid (thrombosed, draining, @ 6 o'clock).  Hard hemorrhoid  With blood clot protruding  Musculoskeletal: Normal range of motion.  Neurological: She is alert and oriented to person, place, and time.  Skin: Skin is warm and dry. No rash noted.  Psychiatric: She has a normal mood and affect. Her behavior is normal.    ED Course  Procedures (including critical care time) DIAGNOSTIC STUDIES: Oxygen Saturation is 100% on room air, normal by my interpretation.    COORDINATION OF CARE:  11:40 PM Discussed course of care with pt . Pt understands and agrees.  Labs Review Labs Reviewed - No data to display Imaging Review No results found.  EKG Interpretation   None       MDM   1. Thrombosed external hemorrhoid    53 year old female here with hemorrhoid. On exam shows a thrombosed hemorrhoid that is open and draining blood. There is small clot within it. I was able to squeeze the rest of the clot out of the hemorrhoid that was already draining. Instructed patient use sitz baths and prescriptions. Given Colace, Anusol. Patient given surgery followup for her hemorrhoids if they remain a problem I personally performed the services described in this documentation, which was scribed in my presence. The recorded information has been reviewed and is accurate.       Osvaldo Shipper, MD 01/21/14 [phone]

## 2014-01-20 NOTE — Discharge Instructions (Signed)
Hemorrhoids Hemorrhoids are swollen veins around the rectum or anus. There are two types of hemorrhoids:   Internal hemorrhoids. These occur in the veins just inside the rectum. They may poke through to the outside and become irritated and painful.  External hemorrhoids. These occur in the veins outside the anus and can be felt as a painful swelling or hard lump near the anus. CAUSES  Pregnancy.   Obesity.   Constipation or diarrhea.   Straining to have a bowel movement.   Sitting for long periods on the toilet.  Heavy lifting or other activity that caused you to strain.  Anal intercourse. SYMPTOMS   Pain.   Anal itching or irritation.   Rectal bleeding.   Fecal leakage.   Anal swelling.   One or more lumps around the anus.  DIAGNOSIS  Your caregiver may be able to diagnose hemorrhoids by visual examination. Other examinations or tests that may be performed include:   Examination of the rectal area with a gloved hand (digital rectal exam).   Examination of anal canal using a small tube (scope).   A blood test if you have lost a significant amount of blood.  A test to look inside the colon (sigmoidoscopy or colonoscopy). TREATMENT Most hemorrhoids can be treated at home. However, if symptoms do not seem to be getting better or if you have a lot of rectal bleeding, your caregiver may perform a procedure to help make the hemorrhoids get smaller or remove them completely. Possible treatments include:   Placing a rubber band at the base of the hemorrhoid to cut off the circulation (rubber band ligation).   Injecting a chemical to shrink the hemorrhoid (sclerotherapy).   Using a tool to burn the hemorrhoid (infrared light therapy).   Surgically removing the hemorrhoid (hemorrhoidectomy).   Stapling the hemorrhoid to block blood flow to the tissue (hemorrhoid stapling).  HOME CARE INSTRUCTIONS   Eat foods with fiber, such as whole grains, beans,  nuts, fruits, and vegetables. Ask your doctor about taking products with added fiber in them (fibersupplements).  Increase fluid intake. Drink enough water and fluids to keep your urine clear or pale yellow.   Exercise regularly.   Go to the bathroom when you have the urge to have a bowel movement. Do not wait.   Avoid straining to have bowel movements.   Keep the anal area dry and clean. Use wet toilet paper or moist towelettes after a bowel movement.   Medicated creams and suppositories may be used or applied as directed.   Only take over-the-counter or prescription medicines as directed by your caregiver.   Take warm sitz baths for 15 20 minutes, 3 4 times a day to ease pain and discomfort.   Place ice packs on the hemorrhoids if they are tender and swollen. Using ice packs between sitz baths may be helpful.   Put ice in a plastic bag.   Place a towel between your skin and the bag.   Leave the ice on for 15 20 minutes, 3 4 times a day.   Do not use a donut-shaped pillow or sit on the toilet for long periods. This increases blood pooling and pain.  SEEK MEDICAL CARE IF:  You have increasing pain and swelling that is not controlled by treatment or medicine.  You have uncontrolled bleeding.  You have difficulty or you are unable to have a bowel movement.  You have pain or inflammation outside the area of the hemorrhoids. MAKE SURE YOU:    Understand these instructions.  Will watch your condition.  Will get help right away if you are not doing well or get worse. Document Released: 12/08/2000 Document Revised: 11/27/2012 Document Reviewed: 10/15/2012 [employer] Patient Information 2014 [employer].  

## 2014-01-20 NOTE — ED Notes (Signed)
C/o bleeding hemorrhoid x 3 days

## 2015-11-10 ENCOUNTER — Encounter (HOSPITAL_BASED_OUTPATIENT_CLINIC_OR_DEPARTMENT_OTHER): Payer: Self-pay | Admitting: Emergency Medicine

## 2015-11-10 ENCOUNTER — Emergency Department (HOSPITAL_BASED_OUTPATIENT_CLINIC_OR_DEPARTMENT_OTHER)
Admission: EM | Admit: 2015-11-10 | Discharge: 2015-11-10 | Disposition: A | Discharge status: Home / Self Care | Payer: 59 | Attending: Emergency Medicine | Admitting: Emergency Medicine

## 2015-11-10 DIAGNOSIS — Z880 Allergy status to penicillin: Secondary | ICD-10-CM | POA: Insufficient documentation

## 2015-11-10 DIAGNOSIS — I10 Essential (primary) hypertension: Secondary | ICD-10-CM | POA: Insufficient documentation

## 2015-11-10 DIAGNOSIS — J302 Other seasonal allergic rhinitis: Secondary | ICD-10-CM | POA: Diagnosis not present

## 2015-11-10 DIAGNOSIS — R05 Cough: Secondary | ICD-10-CM | POA: Diagnosis present

## 2015-11-10 DIAGNOSIS — Z79899 Other long term (current) drug therapy: Secondary | ICD-10-CM | POA: Insufficient documentation

## 2015-11-10 DIAGNOSIS — Z9109 Other allergy status, other than to drugs and biological substances: Secondary | ICD-10-CM

## 2015-11-10 DIAGNOSIS — R059 Cough, unspecified: Secondary | ICD-10-CM

## 2015-11-10 MED ORDER — FLUTICASONE PROPIONATE 50 MCG/ACT NA SUSP: 2.0000 | Freq: Every day | NASAL | Status: DC

## 2015-11-10 MED ORDER — FEXOFENADINE HCL 180 MG PO TABS: 180.0000 mg | ORAL_TABLET | Freq: Every day | ORAL | Status: DC

## 2015-11-10 NOTE — ED Notes (Signed)
Cough x3 days, denies n/v/d or fever. Has tried OTC meds w/o relief.

## 2015-11-10 NOTE — ED Provider Notes (Signed)
CSN: [id number]     Arrival date & time 11/10/15  1916 History  By signing my name below, I, Soijett Blue, attest that this documentation has been prepared under the direction and in the presence of Leo Grosser, MD. Electronically Signed: Soijett Blue, ED Scribe. 11/10/2015. 8:09 PM.   Chief Complaint  Patient presents with  . Cough      Patient is a 54 y.o. female presenting with cough. The history is provided by the patient. No language interpreter was used.  Cough Cough characteristics:  Dry Severity:  Moderate Onset quality:  Sudden Duration:  3 days Timing:  Constant Progression:  Unchanged Chronicity:  New Smoker: no   Context: not exposure to allergens, not sick contacts and not smoke exposure   Relieved by:  Nothing Worsened by:  Nothing tried Ineffective treatments: nyquil, dayquil and benadryl. Associated symptoms: rhinorrhea   Associated symptoms: no fever and no sore throat     Past Medical History  Diagnosis Date  . Hypertension    Past Surgical History  Procedure Laterality Date  . Cesarean section     No family history on file. Social History  Substance Use Topics  . Smoking status: Never Smoker   . Smokeless tobacco: Never Used  . Alcohol Use: No   OB History    No data available     Review of Systems  Constitutional: Negative for fever.  HENT: Positive for congestion and rhinorrhea. Negative for sore throat.   Eyes:       Watery and itchy eyes  Respiratory: Positive for cough.   All other systems reviewed and are negative.   Allergies  Penicillins  Home Medications   Prior to Admission medications   Medication Sig Start Date End Date Taking? Authorizing Provider  amLODipine (NORVASC) 10 MG tablet Take 10 mg by mouth daily.     Yes Historical Provider, MD  diphenhydramine-acetaminophen (TYLENOL PM) 25-500 MG TABS Take 2 tablets by mouth at bedtime as needed. For sleep    Yes Historical Provider, MD  hydrochlorothiazide (HYDRODIURIL)  25 MG tablet Take 25 mg by mouth daily.   Yes Historical Provider, MD  HYDROcodone-acetaminophen (NORCO/VICODIN) 5-325 MG per tablet Take 1 tablet by mouth every 6 (six) hours as needed for moderate pain. 01/20/14  Yes Evelina Bucy, MD  lisinopril (PRINIVIL,ZESTRIL) 40 MG tablet Take 40 mg by mouth daily.     Yes Historical Provider, MD  cloNIDine (CATAPRES) 0.1 MG tablet 1 tab po tid x 2 days, then bid x 2 days, then once daily x 2 days 12/02/13   Veryl Speak, MD  docusate sodium (COLACE) 100 MG capsule Take 1 capsule (100 mg total) by mouth every 12 (twelve) hours. 01/20/14   Evelina Bucy, MD  hydrocortisone (ANUSOL-HC) 25 MG suppository Place 1 suppository (25 mg total) rectally 2 (two) times daily. For 7 days 01/20/14   Evelina Bucy, MD  ibuprofen (ADVIL,MOTRIN) 600 MG tablet Take 1 tablet (600 mg total) by mouth every 6 (six) hours as needed for pain. 05/03/13   Varney Biles, MD  Multiple Vitamin (MULTIVITAMIN) tablet Take 1 tablet by mouth daily.      Historical Provider, MD  ondansetron (ZOFRAN ODT) 8 MG disintegrating tablet Take 1 tablet (8 mg total) by mouth every 8 (eight) hours as needed for nausea. 05/03/13   Varney Biles, MD  phentermine 37.5 MG capsule Take 37.5 mg by mouth every morning.    Historical Provider, MD  PRESCRIPTION MEDICATION Take 1 tablet by mouth daily.  Unknown blood pressure medication     Historical Provider, MD   BP 137/87 mmHg  Pulse 82  Temp(Src) 98.6 F (37 C) (Oral)  Resp 20  Ht 5\' 6"  (1.676 m)  Wt 284 lb (128.822 kg)  BMI 45.86 kg/m2  SpO2 100% Physical Exam  Constitutional: She is oriented to person, place, and time. She appears well-developed and well-nourished. No distress.  HENT:  Head: Normocephalic and atraumatic.  Mouth/Throat: Uvula is midline, oropharynx is clear and moist and mucous membranes are normal.  Mild nasal congestion  Eyes: EOM are normal.  Neck: Neck supple.  Cardiovascular: Normal rate.   Pulmonary/Chest: Effort normal and  breath sounds normal. No respiratory distress. She has no wheezes. She has no rales.  Musculoskeletal: Normal range of motion.  Neurological: She is alert and oriented to person, place, and time.  Skin: Skin is warm and dry.  Psychiatric: She has a normal mood and affect. Her behavior is normal.  Nursing note and vitals reviewed.   ED Course  Procedures (including critical care time) DIAGNOSTIC STUDIES: Oxygen Saturation is 100% on RA, nl by my interpretation.    COORDINATION OF CARE: 8:08 PM Discussed treatment plan with pt at bedside which includes antihistamine, nasal spray, f/u if symptoms worsen and pt agreed to plan.    Labs Review Labs Reviewed - No data to display  Imaging Review No results found.    EKG Interpretation None      MDM   Final diagnoses:  Cough  Environmental allergies   54 y.o. female presents with runny nose and dry cough with mild facial swelling and itching since recent change of weather. C/w seasonal allergies, will start on antihistamines and nasal spray to help with symptoms. No indication for further workup, AFVSS, Patient needs to establish primary care in the area and was provided contact information to do so.   I personally performed the services described in this documentation, which was scribed in my presence. The recorded information has been reviewed and is accurate.    Leo Grosser, MD 11/11/15 [phone]

## 2015-11-10 NOTE — Discharge Instructions (Signed)

## 2015-11-27 ENCOUNTER — Emergency Department (HOSPITAL_BASED_OUTPATIENT_CLINIC_OR_DEPARTMENT_OTHER): Payer: 59

## 2015-11-27 ENCOUNTER — Encounter (HOSPITAL_BASED_OUTPATIENT_CLINIC_OR_DEPARTMENT_OTHER): Payer: Self-pay | Admitting: Emergency Medicine

## 2015-11-27 ENCOUNTER — Emergency Department (HOSPITAL_BASED_OUTPATIENT_CLINIC_OR_DEPARTMENT_OTHER)
Admission: EM | Admit: 2015-11-27 | Discharge: 2015-11-27 | Disposition: A | Discharge status: Home / Self Care | Payer: 59 | Attending: Emergency Medicine | Admitting: Emergency Medicine

## 2015-11-27 DIAGNOSIS — S99921A Unspecified injury of right foot, initial encounter: Secondary | ICD-10-CM | POA: Diagnosis present

## 2015-11-27 DIAGNOSIS — I10 Essential (primary) hypertension: Secondary | ICD-10-CM | POA: Diagnosis not present

## 2015-11-27 DIAGNOSIS — S93114A Dislocation of interphalangeal joint of right lesser toe(s), initial encounter: Secondary | ICD-10-CM | POA: Diagnosis not present

## 2015-11-27 DIAGNOSIS — Z7951 Long term (current) use of inhaled steroids: Secondary | ICD-10-CM | POA: Insufficient documentation

## 2015-11-27 DIAGNOSIS — Y9289 Other specified places as the place of occurrence of the external cause: Secondary | ICD-10-CM | POA: Diagnosis not present

## 2015-11-27 DIAGNOSIS — Z79899 Other long term (current) drug therapy: Secondary | ICD-10-CM | POA: Diagnosis not present

## 2015-11-27 DIAGNOSIS — Y9389 Activity, other specified: Secondary | ICD-10-CM | POA: Insufficient documentation

## 2015-11-27 DIAGNOSIS — Y998 Other external cause status: Secondary | ICD-10-CM | POA: Insufficient documentation

## 2015-11-27 DIAGNOSIS — W51XXXA Accidental striking against or bumped into by another person, initial encounter: Secondary | ICD-10-CM | POA: Diagnosis not present

## 2015-11-27 DIAGNOSIS — Z880 Allergy status to penicillin: Secondary | ICD-10-CM | POA: Diagnosis not present

## 2015-11-27 DIAGNOSIS — S92912A Unspecified fracture of left toe(s), initial encounter for closed fracture: Secondary | ICD-10-CM

## 2015-11-27 MED ORDER — BUPIVACAINE HCL (PF) 0.5 % IJ SOLN
10.0000 mL | Freq: Once | INTRAMUSCULAR | Status: DC
  Filled 2015-11-27: qty 10

## 2015-11-27 MED ORDER — LIDOCAINE HCL (PF) 1 % IJ SOLN
5.0000 mL | Freq: Once | INTRAMUSCULAR | Status: DC
  Filled 2015-11-27: qty 5

## 2015-11-27 NOTE — Discharge Instructions (Signed)
Please call Dr Ericka Pontiff office Monday for recheck next week.

## 2015-11-27 NOTE — ED Provider Notes (Signed)
CSN: [id number]     Arrival date & time 11/27/15  1045 History   First MD Initiated Contact with Patient 11/27/15 1114     Chief Complaint  Patient presents with  . Foot Pain     (Consider location/radiation/quality/duration/timing/severity/associated sxs/prior Treatment) HPI Comments: 54 y.o. Female - grandson fell on her right foot yesterday.  Pain right small toe.  No discoloration, deformity.   Patient is a 54 y.o. female presenting with lower extremity pain. The history is provided by the patient.  Foot Pain This is a new problem. The current episode started 12 to 24 hours ago. The problem occurs constantly. The problem has not changed since onset.Pertinent negatives include no chest pain and no abdominal pain. The symptoms are aggravated by walking. The symptoms are relieved by medications. Treatments tried: Took ibuprofen with some relief.  The treatment provided mild relief.    Past Medical History  Diagnosis Date  . Hypertension    Past Surgical History  Procedure Laterality Date  . Cesarean section     History reviewed. No pertinent family history. Social History  Substance Use Topics  . Smoking status: Never Smoker   . Smokeless tobacco: Never Used  . Alcohol Use: No   OB History    No data available     Review of Systems  Cardiovascular: Negative for chest pain.  Gastrointestinal: Negative for abdominal pain.  All other systems reviewed and are negative.     Allergies  Penicillins  Home Medications   Prior to Admission medications   Medication Sig Start Date End Date Taking? Authorizing Provider  amLODipine (NORVASC) 10 MG tablet Take 10 mg by mouth daily.      Historical Provider, MD  cloNIDine (CATAPRES) 0.1 MG tablet 1 tab po tid x 2 days, then bid x 2 days, then once daily x 2 days 12/02/13   Veryl Speak, MD  diphenhydramine-acetaminophen (TYLENOL PM) 25-500 MG TABS Take 2 tablets by mouth at bedtime as needed. For sleep     Historical Provider, MD   docusate sodium (COLACE) 100 MG capsule Take 1 capsule (100 mg total) by mouth every 12 (twelve) hours. 01/20/14   Evelina Bucy, MD  fexofenadine (ALLEGRA) 180 MG tablet Take 1 tablet (180 mg total) by mouth daily. 11/10/15   Leo Grosser, MD  fluticasone (FLONASE) 50 MCG/ACT nasal spray Place 2 sprays into both nostrils daily. 11/10/15   Leo Grosser, MD  hydrochlorothiazide (HYDRODIURIL) 25 MG tablet Take 25 mg by mouth daily.    Historical Provider, MD  HYDROcodone-acetaminophen (NORCO/VICODIN) 5-325 MG per tablet Take 1 tablet by mouth every 6 (six) hours as needed for moderate pain. 01/20/14   Evelina Bucy, MD  hydrocortisone (ANUSOL-HC) 25 MG suppository Place 1 suppository (25 mg total) rectally 2 (two) times daily. For 7 days 01/20/14   Evelina Bucy, MD  ibuprofen (ADVIL,MOTRIN) 600 MG tablet Take 1 tablet (600 mg total) by mouth every 6 (six) hours as needed for pain. 05/03/13   Varney Biles, MD  lisinopril (PRINIVIL,ZESTRIL) 40 MG tablet Take 40 mg by mouth daily.      Historical Provider, MD  Multiple Vitamin (MULTIVITAMIN) tablet Take 1 tablet by mouth daily.      Historical Provider, MD  ondansetron (ZOFRAN ODT) 8 MG disintegrating tablet Take 1 tablet (8 mg total) by mouth every 8 (eight) hours as needed for nausea. 05/03/13   Varney Biles, MD  phentermine 37.5 MG capsule Take 37.5 mg by mouth every morning.    Historical  Provider, MD  PRESCRIPTION MEDICATION Take 1 tablet by mouth daily. Unknown blood pressure medication     Historical Provider, MD   BP 145/112 mmHg  Pulse 72  Temp(Src) 98.4 F (36.9 C) (Oral)  Resp 20  Ht 5\' 6"  (1.676 m)  Wt 128.822 kg  BMI 45.86 kg/m2  SpO2 100% Physical Exam  Constitutional: She appears well-developed and well-nourished.  Musculoskeletal:       Feet:  Right foot without deformity.  dp 2 plus,  ttp lateral right small toe.   Vitals reviewed.   ED Course  .Nerve Block Date/Time: 11/27/2015 1:41 PM Performed by: Pattricia Boss Authorized by: Pattricia Boss   (including critical care time) Labs Review Labs Reviewed - No data to display  Imaging Review Dg Foot Complete Right  11/27/2015  CLINICAL DATA:  Right foot pain after injury last night. Pain primarily in the small toe. EXAM: RIGHT FOOT COMPLETE - 3+ VIEW COMPARISON:  None. FINDINGS: The small toe appears dorsally and laterally dislocated at the proximal interphalangeal joint. There is a possible associated nondisplaced fracture of the proximal phalangeal head. No other acute osseous findings are seen. There are degenerative changes at the first metatarsal phalangeal joint with an associated hallux valgus deformity. There is fragmented spurring of the calcaneal tuberosity superiorly. IMPRESSION: Dislocation of the fifth proximal interphalangeal joint with possible associated nondisplaced fracture of the proximal phalangeal head. Electronically Signed   By: Richardean Sale M.D.   On: 11/27/2015 11:25   Dg Toe 5th Right  11/27/2015  CLINICAL DATA:  Follow-up right fifth toe dislocation and possible fracture EXAM: RIGHT FIFTH TOE COMPARISON:  Right foot radiographs from earlier today FINDINGS: There is persistent dorsal dislocation of the right fifth toe at the level of the proximal interphalangeal joint. No fracture or suspicious focal osseous lesion are detected. IMPRESSION: Persistent dorsal dislocation of the right fifth toe at the level of the PIP joint. No fracture detected. Electronically Signed   By: Ilona Sorrel M.D.   On: 11/27/2015 12:54   I have personally reviewed and evaluated these images and lab results as part of my medical decision-making.   EKG Interpretation None      MDM   Final diagnoses:  Fracture dislocation of toe joint, left, closed, initial encounter        Pattricia Boss, MD 11/27/15 1415

## 2015-11-27 NOTE — ED Notes (Signed)
Pt reports that grandson fell on left foot

## 2015-12-30 NOTE — ED Provider Notes (Signed)
Addendum to my note of 11/27/15 Digital block placed left fifth toe. NERVE BLOCK Performed by: Shaune Pollack Consent: Verbal consent obtained. Required items: required blood products, implants, devices, and special equipment available Time out: Immediately prior to procedure a "time out" was called to verify the correct patient, procedure, equipment, support staff and site/side marked as required.  Indication: fracture  Nerve block body site: left fifth toe  Preparation: Patient was prepped and draped in the usual sterile fashion. Needle gauge: 24 G Location technique: anatomical landmarks  Local anesthetic: 1% lidocain  Anesthetic total: 3 ml  Outcome: pain improved Patient tolerance: Patient tolerated the procedure well with no immediate complications.  Reduction of dislocation Date/Time: 3:26 PM Performed by: Shaune Pollack Authorized by: Shaune Pollack Consent: Verbal consent obtained. Risks and benefits: risks, benefits and alternatives were discussed Consent given by: patient Required items: required blood products, implants, devices, and special equipment available Time out: Immediately prior to procedure a "time out" was called to verify the correct patient, procedure, equipment, support staff and site/side marked as required.  Patient sedated: no   Vitals: Vital signs were monitored during sedation. Patient tolerance: Patient tolerated the procedure well with no immediate complications. Joint: left fifth toe Reduction technique: traction  Patient neurovascularly intact after manipulation.  Patient placed in post op shoe and advised regarding follow up and return precautions.  This is a late addendum.      Pattricia Boss, MD 12/30/15 [phone]

## 2016-03-13 ENCOUNTER — Encounter (HOSPITAL_BASED_OUTPATIENT_CLINIC_OR_DEPARTMENT_OTHER): Payer: Self-pay | Admitting: Emergency Medicine

## 2016-03-13 DIAGNOSIS — K029 Dental caries, unspecified: Secondary | ICD-10-CM | POA: Insufficient documentation

## 2016-03-13 DIAGNOSIS — I10 Essential (primary) hypertension: Secondary | ICD-10-CM | POA: Insufficient documentation

## 2016-03-13 DIAGNOSIS — Z79899 Other long term (current) drug therapy: Secondary | ICD-10-CM | POA: Diagnosis not present

## 2016-03-13 DIAGNOSIS — Z880 Allergy status to penicillin: Secondary | ICD-10-CM | POA: Diagnosis not present

## 2016-03-13 DIAGNOSIS — K0381 Cracked tooth: Secondary | ICD-10-CM | POA: Insufficient documentation

## 2016-03-13 DIAGNOSIS — K0889 Other specified disorders of teeth and supporting structures: Secondary | ICD-10-CM | POA: Diagnosis present

## 2016-03-13 DIAGNOSIS — Z7951 Long term (current) use of inhaled steroids: Secondary | ICD-10-CM | POA: Diagnosis not present

## 2016-03-13 NOTE — ED Notes (Signed)
Patient states that she is having pain to her left mouth region

## 2016-03-14 ENCOUNTER — Emergency Department (HOSPITAL_BASED_OUTPATIENT_CLINIC_OR_DEPARTMENT_OTHER)
Admission: EM | Admit: 2016-03-14 | Discharge: 2016-03-14 | Disposition: A | Discharge status: Home / Self Care | Payer: BLUE CROSS/BLUE SHIELD | Attending: Emergency Medicine | Admitting: Emergency Medicine

## 2016-03-14 ENCOUNTER — Encounter (HOSPITAL_BASED_OUTPATIENT_CLINIC_OR_DEPARTMENT_OTHER): Payer: Self-pay | Admitting: Emergency Medicine

## 2016-03-14 DIAGNOSIS — K029 Dental caries, unspecified: Secondary | ICD-10-CM

## 2016-03-14 MED ORDER — CLINDAMYCIN HCL 150 MG PO CAPS
300.0000 mg | ORAL_CAPSULE | Freq: Once | ORAL | Status: AC
  Administered 2016-03-14: 300 mg via ORAL
  Filled 2016-03-14: qty 2

## 2016-03-14 MED ORDER — BUPIVACAINE-EPINEPHRINE (PF) 0.5% -1:200000 IJ SOLN
1.8000 mL | Freq: Once | INTRAMUSCULAR | Status: AC
  Administered 2016-03-14: 1.8 mL
  Filled 2016-03-14: qty 1.8

## 2016-03-14 MED ORDER — HYDROCODONE-ACETAMINOPHEN 5-325 MG PO TABS: 1.0000 | ORAL_TABLET | Freq: Four times a day (QID) | ORAL | Status: DC | PRN

## 2016-03-14 MED ORDER — CLINDAMYCIN HCL 150 MG PO CAPS: 150.0000 mg | ORAL_CAPSULE | Freq: Three times a day (TID) | ORAL | Status: DC

## 2016-03-14 NOTE — ED Provider Notes (Signed)
CSN: [id number]     Arrival date & time 03/13/16  2350 History   First MD Initiated Contact with Patient 03/14/16 0115     Chief Complaint  Patient presents with  . Dental Pain     (Consider location/radiation/quality/duration/timing/severity/associated sxs/prior Treatment) HPI  This is a 55 year old female with a one-day history of pain in her left lower first premolar. She states his tooth broke off several weeks ago. She is now having pain severe enough to prevent her from sleeping. She denies systemic symptoms such as fever or chills. She denies cervical lymphadenopathy. Pain is worse with eating or drinking. Pain is moderate to severe. She has taken Advil and placed Orajel without relief.  Past Medical History  Diagnosis Date  . Hypertension    Past Surgical History  Procedure Laterality Date  . Cesarean section     History reviewed. No pertinent family history. Social History  Substance Use Topics  . Smoking status: Never Smoker   . Smokeless tobacco: Never Used  . Alcohol Use: No   OB History    No data available     Review of Systems  All other systems reviewed and are negative.   Allergies  Penicillins  Home Medications   Prior to Admission medications   Medication Sig Start Date End Date Taking? Authorizing Provider  loratadine (CLARITIN) 10 MG tablet Take 10 mg by mouth daily.   Yes Historical Provider, MD  amLODipine (NORVASC) 10 MG tablet Take 10 mg by mouth daily.      Historical Provider, MD  cloNIDine (CATAPRES) 0.1 MG tablet 1 tab po tid x 2 days, then bid x 2 days, then once daily x 2 days 12/02/13   Veryl Speak, MD  diphenhydramine-acetaminophen (TYLENOL PM) 25-500 MG TABS Take 2 tablets by mouth at bedtime as needed. For sleep     Historical Provider, MD  docusate sodium (COLACE) 100 MG capsule Take 1 capsule (100 mg total) by mouth every 12 (twelve) hours. 01/20/14   Evelina Bucy, MD  fexofenadine (ALLEGRA) 180 MG tablet Take 1 tablet (180 mg total)  by mouth daily. 11/10/15   Leo Grosser, MD  fluticasone (FLONASE) 50 MCG/ACT nasal spray Place 2 sprays into both nostrils daily. 11/10/15   Leo Grosser, MD  hydrochlorothiazide (HYDRODIURIL) 25 MG tablet Take 25 mg by mouth daily.    Historical Provider, MD  HYDROcodone-acetaminophen (NORCO/VICODIN) 5-325 MG per tablet Take 1 tablet by mouth every 6 (six) hours as needed for moderate pain. 01/20/14   Evelina Bucy, MD  hydrocortisone (ANUSOL-HC) 25 MG suppository Place 1 suppository (25 mg total) rectally 2 (two) times daily. For 7 days 01/20/14   Evelina Bucy, MD  ibuprofen (ADVIL,MOTRIN) 600 MG tablet Take 1 tablet (600 mg total) by mouth every 6 (six) hours as needed for pain. 05/03/13   Varney Biles, MD  lisinopril (PRINIVIL,ZESTRIL) 40 MG tablet Take 40 mg by mouth daily.      Historical Provider, MD  Multiple Vitamin (MULTIVITAMIN) tablet Take 1 tablet by mouth daily.      Historical Provider, MD  ondansetron (ZOFRAN ODT) 8 MG disintegrating tablet Take 1 tablet (8 mg total) by mouth every 8 (eight) hours as needed for nausea. 05/03/13   Varney Biles, MD  phentermine 37.5 MG capsule Take 37.5 mg by mouth every morning.    Historical Provider, MD  PRESCRIPTION MEDICATION Take 1 tablet by mouth daily. Unknown blood pressure medication     Historical Provider, MD   BP 149/77 mmHg  Pulse 74  Temp(Src) 98.1 F (36.7 C) (Oral)  Resp 18  Ht 5\' 6"  (1.676 m)  Wt 284 lb (128.822 kg)  BMI 45.86 kg/m2  SpO2 100%  LMP 02/14/2016   Physical Exam  General: Well-developed, well-nourished female in no acute distress; appearance consistent with age of record HENT: normocephalic; atraumatic; multiple missing teeth; carious left lower first premolar Eyes: pupils equal, round and reactive to light; extraocular muscles intact Neck: supple; no lymphadenopathy Heart: regular rate and rhythm Lungs: clear to auscultation bilaterally Abdomen: soft; nondistended; nontender; bowel sounds  present Extremities: No deformity; full range of motion; pulses normal Neurologic: Awake, alert and oriented; motor function intact in all extremities and symmetric; no facial droop Skin: Warm and dry Psychiatric: Normal mood and affect    ED Course  Procedures (including critical care time)  DENTAL BLOCK 1.8 milliliters of 0.5% bupivacaine with epinephrine were injected into the buccal fold adjacent to the left lower first premolar. The patient tolerated this well and there were no immediate complications. Adequate analgesia was obtained.  MDM     Shanon Rosser, MD 03/14/16 [id number]

## 2016-03-14 NOTE — ED Notes (Signed)
Pt states she has had dental issues x sev weeks, but got worse tonight.

## 2017-11-02 ENCOUNTER — Other Ambulatory Visit: Payer: Self-pay

## 2017-11-02 ENCOUNTER — Encounter (HOSPITAL_BASED_OUTPATIENT_CLINIC_OR_DEPARTMENT_OTHER): Payer: Self-pay

## 2017-11-02 ENCOUNTER — Emergency Department (HOSPITAL_BASED_OUTPATIENT_CLINIC_OR_DEPARTMENT_OTHER)
Admission: EM | Admit: 2017-11-02 | Discharge: 2017-11-02 | Disposition: A | Discharge status: Home / Self Care | Payer: No Typology Code available for payment source | Attending: Emergency Medicine | Admitting: Emergency Medicine

## 2017-11-02 DIAGNOSIS — Z87891 Personal history of nicotine dependence: Secondary | ICD-10-CM | POA: Insufficient documentation

## 2017-11-02 DIAGNOSIS — N939 Abnormal uterine and vaginal bleeding, unspecified: Secondary | ICD-10-CM

## 2017-11-02 DIAGNOSIS — I10 Essential (primary) hypertension: Secondary | ICD-10-CM | POA: Insufficient documentation

## 2017-11-02 DIAGNOSIS — Z79899 Other long term (current) drug therapy: Secondary | ICD-10-CM | POA: Insufficient documentation

## 2017-11-02 LAB — URINALYSIS, MICROSCOPIC (REFLEX)

## 2017-11-02 LAB — CBC WITH DIFFERENTIAL/PLATELET
Basophils Absolute: 0 10*3/uL (ref 0.0–0.1)
Basophils Relative: 0 %
EOS PCT: 8 %
Eosinophils Absolute: 0.5 10*3/uL (ref 0.0–0.7)
HEMATOCRIT: 39.5 % (ref 36.0–46.0)
HEMOGLOBIN: 13 g/dL (ref 12.0–15.0)
Lymphocytes Relative: 35 %
Lymphs Abs: 2.3 10*3/uL (ref 0.7–4.0)
MCH: 28.4 pg (ref 26.0–34.0)
MCHC: 32.9 g/dL (ref 30.0–36.0)
MCV: 86.4 fL (ref 78.0–100.0)
MONO ABS: 0.6 10*3/uL (ref 0.1–1.0)
Monocytes Relative: 9 %
NEUTROS ABS: 3.3 10*3/uL (ref 1.7–7.7)
NEUTROS PCT: 48 %
Platelets: 358 10*3/uL (ref 150–400)
RBC: 4.57 MIL/uL (ref 3.87–5.11)
RDW: 14.9 % (ref 11.5–15.5)
WBC: 6.7 10*3/uL (ref 4.0–10.5)

## 2017-11-02 LAB — PREGNANCY, URINE: PREG TEST UR: NEGATIVE

## 2017-11-02 LAB — URINALYSIS, ROUTINE W REFLEX MICROSCOPIC
Bilirubin Urine: NEGATIVE
Glucose, UA: NEGATIVE mg/dL
KETONES UR: NEGATIVE mg/dL
NITRITE: NEGATIVE
PROTEIN: NEGATIVE mg/dL
Specific Gravity, Urine: 1.01 (ref 1.005–1.030)
pH: 7 (ref 5.0–8.0)

## 2017-11-02 NOTE — Discharge Instructions (Signed)
Your bleeding could just be a period.  Your blood work looks fine.  You will need to be followed.

## 2017-11-02 NOTE — ED Triage Notes (Signed)
C/o heavy vaginal bleeding started yesterday-states LMP was April-NAD-steady gait

## 2017-11-02 NOTE — ED Notes (Signed)
ED Provider at bedside. 

## 2017-11-02 NOTE — ED Provider Notes (Signed)
[facility] Provider Note   CSN: [id number] Arrival date & time: 11/02/17  1959     History   Chief Complaint Chief Complaint  Patient presents with  . Vaginal Bleeding    HPI Sheila Carlson is a 56 y.o. female.  HPI Patient presents with vaginal bleeding.  Started somewhat light yesterday but more severe today.  States she has had to use 5 pads.  Last menses was in April.  She was irregular before that and thought she had gone through menopause.  Slight dull abdominal pain.  States she has a history of anemia but does not know what her hemoglobin normally runs. Past Medical History:  Diagnosis Date  . Hypertension     Patient Active Problem List   Diagnosis Date Noted  . OBESITY NOS 06/25/2007  . HYPERTENSION 06/25/2007  . GERD 06/25/2007  . HEMATOCHEZIA 06/25/2007  . ECZEMA 06/25/2007  . ANKLE EDEMA 06/25/2007    Past Surgical History:  Procedure Laterality Date  . CESAREAN SECTION      OB History    No data available       Home Medications    Prior to Admission medications   Medication Sig Start Date End Date Taking? Authorizing Provider  hydrochlorothiazide (HYDRODIURIL) 25 MG tablet Take 25 mg by mouth daily.    [provider]  lisinopril (PRINIVIL,ZESTRIL) 40 MG tablet Take 40 mg by mouth daily.      [provider]    Family History No family history on file.  Social History Social History   Tobacco Use  . Smoking status: Former Research scientist (life sciences)  . Smokeless tobacco: Never Used  Substance Use Topics  . Alcohol use: Yes    Comment: occ  . Drug use: No     Allergies   Penicillins   Review of Systems Review of Systems  Constitutional: Positive for fatigue. Negative for appetite change.  HENT: Negative for congestion.   Respiratory: Negative for shortness of breath.   Cardiovascular: Negative for chest pain.  Gastrointestinal: Negative for abdominal pain.       Mild abdominal cramping without  actual abdominal pain.  Genitourinary: Positive for vaginal bleeding. Negative for flank pain.  Neurological: Negative for speech difficulty.  Hematological: Negative for adenopathy.  Psychiatric/Behavioral: Negative for confusion.     Physical Exam Updated Vital Signs BP (!) 160/65 (BP Location: Right Arm)   Pulse 79   Temp 98.5 F (36.9 C) (Oral)   Resp 18   Ht 5\' 6"  (1.676 m)   Wt (!) 138.8 kg (306 lb)   SpO2 99%   BMI 49.39 kg/m   Physical Exam  Constitutional: She appears well-developed.  HENT:  Head: Normocephalic.  Eyes: Pupils are equal, round, and reactive to light.  Cardiovascular: Normal rate.  Pulmonary/Chest: Effort normal.  Abdominal: There is no tenderness.  Genitourinary:  Genitourinary Comments: Some blood in the vaginal vault.  No active cervical bleeding.  No adnexal tenderness.  Office is closed.  Neurological: She is alert.  Skin: Skin is warm. Capillary refill takes less than 2 seconds.     ED Treatments / Results  Labs (all labs ordered are listed, but only abnormal results are displayed) Labs Reviewed  URINALYSIS, ROUTINE W REFLEX MICROSCOPIC - Abnormal; Notable for the following components:      Result Value   APPearance CLOUDY (*)    Hgb urine dipstick LARGE (*)    Leukocytes, UA TRACE (*)    All other components within  normal limits  URINALYSIS, MICROSCOPIC (REFLEX) - Abnormal; Notable for the following components:   Bacteria, UA FEW (*)    Squamous Epithelial / LPF 0-5 (*)    All other components within normal limits  PREGNANCY, URINE  CBC WITH DIFFERENTIAL/PLATELET    EKG  EKG Interpretation None       Radiology No results found.  Procedures Procedures (including critical care time)  Medications Ordered in ED Medications - No data to display   Initial Impression / Assessment and Plan / ED Course  I have reviewed the triage vital signs and the nursing notes.  Pertinent labs & imaging results that were available  during my care of the patient were reviewed by me and considered in my medical decision making (see chart for details).     Patient with vaginal bleeding.  Could be a delayed menses heading towards menopause.  I do not think this is truly postmenopausal vaginal bleeding.  If resolves patient will just follow-up as normal if continues to be an issue she will follow-up with gynecology.  Final Clinical Impressions(s) / ED Diagnoses   Final diagnoses:  Abnormal vaginal bleeding    ED Discharge Orders    None       Davonna Belling, MD 11/02/17 2226

## 2018-11-27 ENCOUNTER — Emergency Department (HOSPITAL_BASED_OUTPATIENT_CLINIC_OR_DEPARTMENT_OTHER): Payer: Self-pay

## 2018-11-27 ENCOUNTER — Other Ambulatory Visit: Payer: Self-pay

## 2018-11-27 ENCOUNTER — Encounter (HOSPITAL_BASED_OUTPATIENT_CLINIC_OR_DEPARTMENT_OTHER): Payer: Self-pay | Admitting: *Deleted

## 2018-11-27 ENCOUNTER — Emergency Department (HOSPITAL_BASED_OUTPATIENT_CLINIC_OR_DEPARTMENT_OTHER)
Admission: EM | Admit: 2018-11-27 | Discharge: 2018-11-27 | Disposition: A | Discharge status: Home / Self Care | Payer: Self-pay | Attending: Emergency Medicine | Admitting: Emergency Medicine

## 2018-11-27 DIAGNOSIS — Z87891 Personal history of nicotine dependence: Secondary | ICD-10-CM | POA: Insufficient documentation

## 2018-11-27 DIAGNOSIS — J069 Acute upper respiratory infection, unspecified: Secondary | ICD-10-CM | POA: Insufficient documentation

## 2018-11-27 DIAGNOSIS — Z79899 Other long term (current) drug therapy: Secondary | ICD-10-CM | POA: Insufficient documentation

## 2018-11-27 DIAGNOSIS — I10 Essential (primary) hypertension: Secondary | ICD-10-CM | POA: Insufficient documentation

## 2018-11-27 DIAGNOSIS — B9789 Other viral agents as the cause of diseases classified elsewhere: Secondary | ICD-10-CM

## 2018-11-27 MED ORDER — BENZONATATE 100 MG PO CAPS: 100.0000 mg | ORAL_CAPSULE | Freq: Three times a day (TID) | ORAL | 0 refills | Status: DC

## 2018-11-27 MED ORDER — FLUTICASONE PROPIONATE 50 MCG/ACT NA SUSP: 1.0000 | Freq: Every day | NASAL | 0 refills | Status: DC

## 2018-11-27 MED ORDER — CETIRIZINE HCL 10 MG PO TABS: 10.0000 mg | ORAL_TABLET | Freq: Every day | ORAL | 0 refills | Status: DC

## 2018-11-27 NOTE — Discharge Instructions (Signed)
Take Tessalon every 8 hours as needed for cough.  Take Zyrtec once daily to help with your nasal congestion.  Use Flonase once daily for nasal congestion as well.  Please follow-up with your doctor if your symptoms are not improving over the next week.  Please return the emergency department you develop new or worsening symptoms.

## 2018-11-27 NOTE — ED Triage Notes (Signed)
Pt has had coughing and congestion X 6 days.

## 2018-11-27 NOTE — ED Provider Notes (Signed)
[facility] Provider Note   CSN: [id number] Arrival date & time: 11/27/18  1841     History   Chief Complaint Chief Complaint  Patient presents with  . Nasal Congestion  . Cough    HPI Sheila Carlson is a 57 y.o. female with history of hypertension who presents with a 6-day history of cough.  It is productive.  She is also had associated nasal congestion.  She denies any chest pain, shortness of breath, fevers.  She has been taking over-the-counter medications with minimal relief.  HPI  Past Medical History:  Diagnosis Date  . Hypertension     Patient Active Problem List   Diagnosis Date Noted  . OBESITY NOS 06/25/2007  . HYPERTENSION 06/25/2007  . GERD 06/25/2007  . HEMATOCHEZIA 06/25/2007  . ECZEMA 06/25/2007  . ANKLE EDEMA 06/25/2007    Past Surgical History:  Procedure Laterality Date  . CESAREAN SECTION       OB History   None      Home Medications    Prior to Admission medications   Medication Sig Start Date End Date Taking? Authorizing Provider  benzonatate (TESSALON) 100 MG capsule Take 1 capsule (100 mg total) by mouth every 8 (eight) hours. 11/27/18   [place] Jaquith, Bea Graff, PA-C  cetirizine (ZYRTEC ALLERGY) 10 MG tablet Take 1 tablet (10 mg total) by mouth daily. 11/27/18   Carlton Buskey, Bea Graff, PA-C  fluticasone (FLONASE) 50 MCG/ACT nasal spray Place 1 spray into both nostrils daily. 11/27/18   Hetal Proano, Bea Graff, PA-C  hydrochlorothiazide (HYDRODIURIL) 25 MG tablet Take 25 mg by mouth daily.    [provider]  lisinopril (PRINIVIL,ZESTRIL) 40 MG tablet Take 40 mg by mouth daily.      [provider]    Family History History reviewed. No pertinent family history.  Social History Social History   Tobacco Use  . Smoking status: Former Research scientist (life sciences)  . Smokeless tobacco: Never Used  Substance Use Topics  . Alcohol use: Yes    Comment: occ  . Drug use: No     Allergies   Penicillins   Review of  Systems Review of Systems  Constitutional: Negative for fever.  Respiratory: Positive for cough. Negative for shortness of breath.   Cardiovascular: Negative for chest pain.     Physical Exam Updated Vital Signs BP (!) 148/80 (BP Location: Right Arm)   Pulse 62   Temp 98.4 F (36.9 C) (Oral)   Resp 18   Ht 5\' 6"  (1.676 m)   Wt (!) 146.3 kg   SpO2 100%   BMI 52.05 kg/m   Physical Exam  Constitutional: She appears well-developed and well-nourished. No distress.  HENT:  Head: Normocephalic and atraumatic.  Mouth/Throat: Oropharynx is clear and moist. No oropharyngeal exudate.  Eyes: Pupils are equal, round, and reactive to light. Conjunctivae are normal. Right eye exhibits no discharge. Left eye exhibits no discharge. No scleral icterus.  Neck: Normal range of motion. Neck supple. No thyromegaly present.  Cardiovascular: Normal rate, regular rhythm, normal heart sounds and intact distal pulses. Exam reveals no gallop and no friction rub.  No murmur heard. Pulmonary/Chest: Effort normal. No stridor. No respiratory distress. She has decreased breath sounds (obese). She has no wheezes. She has no rales.  Abdominal: Soft. Bowel sounds are normal. She exhibits no distension. There is no tenderness. There is no rebound and no guarding.  Musculoskeletal: She exhibits no edema.  Lymphadenopathy:    She has no cervical adenopathy.  Neurological: She is alert. Coordination normal.  Skin: Skin is warm and dry. No rash noted. She is not diaphoretic. No pallor.  Psychiatric: She has a normal mood and affect.  Nursing note and vitals reviewed.    ED Treatments / Results  Labs (all labs ordered are listed, but only abnormal results are displayed) Labs Reviewed - No data to display  EKG None  Radiology Dg Chest 2 View  Result Date: 11/27/2018 CLINICAL DATA:  Cough. EXAM: CHEST - 2 VIEW COMPARISON:  January 17, 2012 FINDINGS: The heart size and mediastinal contours are within normal  limits. Both lungs are clear. The visualized skeletal structures are unremarkable. IMPRESSION: No active cardiopulmonary disease. Electronically Signed   By: Dorise Bullion III M.D   On: 11/27/2018 20:59    Procedures Procedures (including critical care time)  Medications Ordered in ED Medications - No data to display   Initial Impression / Assessment and Plan / ED Course  I have reviewed the triage vital signs and the nursing notes.  Pertinent labs & imaging results that were available during my care of the patient were reviewed by me and considered in my medical decision making (see chart for details).     Pt symptoms consistent with URI. CXR negative for acute infiltrate. Pt will be discharged with symptomatic treatment.  Discussed return precautions.  Pt is hemodynamically stable & in NAD prior to discharge.   Final Clinical Impressions(s) / ED Diagnoses   Final diagnoses:  Viral URI with cough    ED Discharge Orders         Ordered    benzonatate (TESSALON) 100 MG capsule  Every 8 hours     11/27/18 2110    fluticasone (FLONASE) 50 MCG/ACT nasal spray  Daily     11/27/18 2110    cetirizine (ZYRTEC ALLERGY) 10 MG tablet  Daily     11/27/18 2110           Frederica Kuster, PA-C 11/27/18 2342    Margette Fast, MD 11/28/18 1133

## 2019-05-31 ENCOUNTER — Other Ambulatory Visit: Payer: Self-pay

## 2019-05-31 ENCOUNTER — Encounter (HOSPITAL_BASED_OUTPATIENT_CLINIC_OR_DEPARTMENT_OTHER): Payer: Self-pay | Admitting: Emergency Medicine

## 2019-05-31 ENCOUNTER — Emergency Department (HOSPITAL_BASED_OUTPATIENT_CLINIC_OR_DEPARTMENT_OTHER)
Admission: EM | Admit: 2019-05-31 | Discharge: 2019-05-31 | Disposition: A | Discharge status: Home / Self Care | Payer: BLUE CROSS/BLUE SHIELD | Attending: Emergency Medicine | Admitting: Emergency Medicine

## 2019-05-31 DIAGNOSIS — E876 Hypokalemia: Secondary | ICD-10-CM

## 2019-05-31 DIAGNOSIS — Z87891 Personal history of nicotine dependence: Secondary | ICD-10-CM | POA: Diagnosis not present

## 2019-05-31 DIAGNOSIS — I10 Essential (primary) hypertension: Secondary | ICD-10-CM

## 2019-05-31 DIAGNOSIS — Z79899 Other long term (current) drug therapy: Secondary | ICD-10-CM | POA: Insufficient documentation

## 2019-05-31 LAB — BASIC METABOLIC PANEL
Anion gap: 11 (ref 5–15)
BUN: 14 mg/dL (ref 6–20)
CO2: 27 mmol/L (ref 22–32)
Calcium: 8.9 mg/dL (ref 8.9–10.3)
Chloride: 102 mmol/L (ref 98–111)
Creatinine, Ser: 0.79 mg/dL (ref 0.44–1.00)
GFR calc Af Amer: >60 mL/min (ref >60)
GFR calc non Af Amer: >60 mL/min (ref >60)
Glucose, Bld: 144 mg/dL — ABNORMAL HIGH (ref 70–99)
Potassium: 2.9 mmol/L — ABNORMAL LOW (ref 3.5–5.1)
Sodium: 140 mmol/L (ref 135–145)

## 2019-05-31 LAB — CBC
HCT: 39.8 % (ref 36.0–46.0)
Hemoglobin: 12.6 g/dL (ref 12.0–15.0)
MCH: 27.8 pg (ref 26.0–34.0)
MCHC: 31.7 g/dL (ref 30.0–36.0)
MCV: 87.9 fL (ref 80.0–100.0)
Platelets: 354 10*3/uL (ref 150–400)
RBC: 4.53 MIL/uL (ref 3.87–5.11)
RDW: 14.1 % (ref 11.5–15.5)
WBC: 5.6 10*3/uL (ref 4.0–10.5)
nRBC: 0 % (ref 0.0–0.2)

## 2019-05-31 MED ORDER — LABETALOL HCL 5 MG/ML IV SOLN
10.0000 mg | Freq: Once | INTRAVENOUS | Status: AC
  Administered 2019-05-31: 10:00:00 10 mg via INTRAVENOUS
  Filled 2019-05-31: qty 4

## 2019-05-31 MED ORDER — POTASSIUM CHLORIDE CRYS ER 20 MEQ PO TBCR
40.0000 meq | EXTENDED_RELEASE_TABLET | Freq: Once | ORAL | Status: AC
  Administered 2019-05-31: 11:00:00 40 meq via ORAL
  Filled 2019-05-31: qty 2

## 2019-05-31 MED ORDER — POTASSIUM CHLORIDE ER 20 MEQ PO TBCR: 20.0000 meq | EXTENDED_RELEASE_TABLET | Freq: Two times a day (BID) | ORAL | 0 refills | Status: DC

## 2019-05-31 NOTE — Discharge Instructions (Signed)
Discontinued the weight loss supplements.  They could be contributing to your hypertension.  You can increase your hydrochlorothiazide to 50 mg/day for your blood pressure.  Your potassium was already low and the hydrochlorothiazide will exacerbate that so take the potassium supplement as prescribed.  Follow-up with your doctor to recheck on your blood pressure

## 2019-05-31 NOTE — ED Provider Notes (Signed)
[facility] Provider Note   CSN: [id number] Arrival date & time: 05/31/19  0932    History   Chief Complaint Chief Complaint  Patient presents with  . Hypertension    HPI Sheila Carlson is a 58 y.o. female.     HPI Patient presents emergency room for evaluation of hypertension and lightheadedness.  Patient states she noticed this morning she was feeling lightheaded.  She took her blood pressure and it was very elevated.  Patient states normally her blood pressure runs in the 140s over 90s.  She denies any vertigo type symptoms.  She is not having any difficulty with headache, trouble with her speech, numbness or weakness, issues with her gait or trouble with her vision.  Patient has been taking her blood pressure medications regularly.  She did start using a weight loss supplement in the last few weeks.  Patient states the name of it is thrive.  She denies trouble chest pain or shortness of breath.  No other complaints. Past Medical History:  Diagnosis Date  . Hypertension     Patient Active Problem List   Diagnosis Date Noted  . OBESITY NOS 06/25/2007  . HYPERTENSION 06/25/2007  . GERD 06/25/2007  . HEMATOCHEZIA 06/25/2007  . ECZEMA 06/25/2007  . ANKLE EDEMA 06/25/2007    Past Surgical History:  Procedure Laterality Date  . CESAREAN SECTION       OB History    Gravida  5   Para  5   Term  5   Preterm      AB      Living        SAB      TAB      Ectopic      Multiple      Live Births               Home Medications    Prior to Admission medications   Medication Sig Start Date End Date Taking? Authorizing Provider  amLODipine (NORVASC) 10 MG tablet Take 10 mg by mouth daily.   Yes [provider]  fluticasone (FLONASE) 50 MCG/ACT nasal spray Place 1 spray into both nostrils daily. 11/27/18  Yes Law, Alexandra M, PA-C  hydrochlorothiazide (HYDRODIURIL) 25 MG tablet Take 25 mg by mouth daily.   Yes  [provider]  lisinopril (PRINIVIL,ZESTRIL) 40 MG tablet Take 40 mg by mouth daily.     Yes [provider]  benzonatate (TESSALON) 100 MG capsule Take 1 capsule (100 mg total) by mouth every 8 (eight) hours. 11/27/18   Law, Bea Graff, PA-C  cetirizine (ZYRTEC ALLERGY) 10 MG tablet Take 1 tablet (10 mg total) by mouth daily. 11/27/18   Law, Bea Graff, PA-C  potassium chloride 20 MEQ TBCR Take 20 mEq by mouth 2 (two) times daily. 05/31/19   Dorie Rank, MD    Family History History reviewed. No pertinent family history.  Social History Social History   Tobacco Use  . Smoking status: Former Research scientist (life sciences)  . Smokeless tobacco: Never Used  Substance Use Topics  . Alcohol use: Yes    Comment: occ  . Drug use: No     Allergies   Penicillins   Review of Systems Review of Systems  All other systems reviewed and are negative.    Physical Exam Updated Vital Signs BP (!) 160/79   Pulse 87   Temp 98.4 F (36.9 C) (Oral)   Resp 19   Ht 1.676 m (5\' 6" )  Wt (!) 140 kg   SpO2 97%   BMI 49.82 kg/m   Physical Exam Vitals signs and nursing note reviewed.  Constitutional:      General: She is not in acute distress.    Appearance: She is well-developed.  HENT:     Head: Normocephalic and atraumatic.     Right Ear: External ear normal.     Left Ear: External ear normal.  Eyes:     General: No scleral icterus.       Right eye: No discharge.        Left eye: No discharge.     Conjunctiva/sclera: Conjunctivae normal.  Neck:     Musculoskeletal: Neck supple.     Trachea: No tracheal deviation.  Cardiovascular:     Rate and Rhythm: Normal rate and regular rhythm.  Pulmonary:     Effort: Pulmonary effort is normal. No respiratory distress.     Breath sounds: Normal breath sounds. No stridor. No wheezing or rales.  Abdominal:     General: Bowel sounds are normal. There is no distension.     Palpations: Abdomen is soft.     Tenderness: There is no abdominal  tenderness. There is no guarding or rebound.  Musculoskeletal:        General: No tenderness.  Skin:    General: Skin is warm and dry.     Findings: No rash.  Neurological:     Mental Status: She is alert and oriented to person, place, and time.     Cranial Nerves: No cranial nerve deficit (No facial droop, extraocular movements intact, tongue midline ).     Sensory: No sensory deficit.     Motor: No abnormal muscle tone or seizure activity.     Coordination: Coordination normal.     Comments: No pronator drift bilateral upper extrem, able to hold both legs off bed for 5 seconds, sensation intact in all extremities, no visual field cuts, no left or right sided neglect, normal finger-nose exam bilaterally, no nystagmus noted       ED Treatments / Results  Labs (all labs ordered are listed, but only abnormal results are displayed) Labs Reviewed  BASIC METABOLIC PANEL - Abnormal; Notable for the following components:      Result Value   Potassium 2.9 (*)    Glucose, Bld 144 (*)    All other components within normal limits  CBC    EKG EKG Interpretation  Date/Time:  Saturday May 31 2019 09:43:04 EDT Ventricular Rate:  82 PR Interval:    QRS Duration: 90 QT Interval:  425 QTC Calculation: 497 R Axis:   38 Text Interpretation:  Sinus rhythm Probable left atrial enlargement Borderline T abnormalities, lateral leads Borderline prolonged QT interval Baseline wander in lead(s) I III aVL aVF V6 No significant change since last tracing Confirmed by Dorie Rank [phone]) on 05/31/2019 9:45:44 AM   Radiology No results found.  Procedures Procedures (including critical care time)  Medications Ordered in ED Medications  labetalol (NORMODYNE) injection 10 mg (10 mg Intravenous Given 05/31/19 1005)  potassium chloride SA (K-DUR) CR tablet 40 mEq (40 mEq Oral Given 05/31/19 1112)     Initial Impression / Assessment and Plan / ED Course  I have reviewed the triage vital signs and the  nursing notes.  Pertinent labs & imaging results that were available during my care of the patient were reviewed by me and considered in my medical decision making (see chart for details).  Clinical Course as  of May 30 1120  Sat May 31, 2019  1107 Patient's blood pressure is improved with treatment.  Labs notable for hypokalemia otherwise unremarkable.  Patient will be given a dose of potassium   [JK]    Clinical Course User Index [JK] Dorie Rank, MD    Patient presented with hypertension and dizziness.  She did not have any focal neurologic deficit to suggest stroke or TIA.  Patient symptoms improved as her blood pressure decreased.  Patient is already taking lisinopril, Norvasc and hydrochlorothiazide.  She can go up on her hydrochlorothiazide.  I will have her also take a potassium supplement because of hypokalemia.  Patient states her blood pressure usually is well controlled.  She did start a weight loss supplement.  Is possible this is contributing to her hypertension.  I recommend she discontinue that supplement for now.  Patient will follow up with her primary care doctor. Final Clinical Impressions(s) / ED Diagnoses   Final diagnoses:  Hypertension, unspecified type  Hypokalemia    ED Discharge Orders         Ordered    potassium chloride 20 MEQ TBCR  2 times daily     05/31/19 1121           Dorie Rank, MD 05/31/19 1122

## 2019-05-31 NOTE — ED Triage Notes (Signed)
Pt here with hypertension and dizziness starting this morning. States she is taking her medications as directed.

## 2019-06-23 LAB — HM DIABETES EYE EXAM

## 2019-10-23 ENCOUNTER — Other Ambulatory Visit: Payer: Self-pay

## 2019-10-23 ENCOUNTER — Encounter (HOSPITAL_BASED_OUTPATIENT_CLINIC_OR_DEPARTMENT_OTHER): Payer: Self-pay | Admitting: *Deleted

## 2019-10-23 ENCOUNTER — Emergency Department (HOSPITAL_BASED_OUTPATIENT_CLINIC_OR_DEPARTMENT_OTHER): Payer: BLUE CROSS/BLUE SHIELD

## 2019-10-23 DIAGNOSIS — I10 Essential (primary) hypertension: Secondary | ICD-10-CM | POA: Diagnosis not present

## 2019-10-23 DIAGNOSIS — M25561 Pain in right knee: Secondary | ICD-10-CM | POA: Diagnosis present

## 2019-10-23 DIAGNOSIS — Z79899 Other long term (current) drug therapy: Secondary | ICD-10-CM | POA: Diagnosis not present

## 2019-10-23 DIAGNOSIS — Z87891 Personal history of nicotine dependence: Secondary | ICD-10-CM | POA: Diagnosis not present

## 2019-10-23 NOTE — ED Triage Notes (Signed)
She slipped on oil and fell. Left knee injury.

## 2019-10-24 ENCOUNTER — Emergency Department (HOSPITAL_BASED_OUTPATIENT_CLINIC_OR_DEPARTMENT_OTHER)
Admission: EM | Admit: 2019-10-24 | Discharge: 2019-10-24 | Disposition: A | Discharge status: Home / Self Care | Payer: BLUE CROSS/BLUE SHIELD | Attending: Emergency Medicine | Admitting: Emergency Medicine

## 2019-10-24 DIAGNOSIS — M25562 Pain in left knee: Secondary | ICD-10-CM

## 2019-10-24 MED ORDER — OXYCODONE-ACETAMINOPHEN 5-325 MG PO TABS
1.0000 | ORAL_TABLET | Freq: Once | ORAL | Status: AC
  Administered 2019-10-24: 1 via ORAL
  Filled 2019-10-24: qty 1

## 2019-10-24 MED ORDER — IBUPROFEN 800 MG PO TABS: 800.0000 mg | ORAL_TABLET | Freq: Three times a day (TID) | ORAL | 0 refills | Status: DC | PRN

## 2019-10-24 MED ORDER — ONDANSETRON 4 MG PO TBDP
4.0000 mg | ORAL_TABLET | Freq: Once | ORAL | Status: AC
  Administered 2019-10-24: 4 mg via ORAL
  Filled 2019-10-24: qty 1

## 2019-10-24 MED ORDER — OXYCODONE-ACETAMINOPHEN 5-325 MG PO TABS: 1.0000 | ORAL_TABLET | Freq: Four times a day (QID) | ORAL | 0 refills | Status: DC | PRN

## 2019-10-24 MED ORDER — ONDANSETRON 4 MG PO TBDP: 4.0000 mg | ORAL_TABLET | Freq: Four times a day (QID) | ORAL | 0 refills | Status: DC | PRN

## 2019-10-24 MED ORDER — IBUPROFEN 800 MG PO TABS
800.0000 mg | ORAL_TABLET | Freq: Once | ORAL | Status: AC
  Administered 2019-10-24: 800 mg via ORAL
  Filled 2019-10-24: qty 1

## 2019-10-24 NOTE — ED Provider Notes (Signed)
TIME SEEN: 12:54 AM  CHIEF COMPLAINT: Left knee pain  HPI: Patient is a 58 year old female with history of hypertension, obesity who presents to the emergency department with left knee pain.  States that she slipped on oil and fell down onto her left knee.  She did not hit her head.  No neck or back pain, chest or abdominal pain.  Has pain with ambulating.  No other injury.  ROS: See HPI Constitutional: no fever  Eyes: no drainage  ENT: no runny nose   Cardiovascular:  no chest pain  Resp: no SOB  GI: no vomiting GU: no dysuria Integumentary: no rash  Allergy: no hives  Musculoskeletal: no leg swelling  Neurological: no slurred speech ROS otherwise negative  PAST MEDICAL HISTORY/PAST SURGICAL HISTORY:  Past Medical History:  Diagnosis Date  . Hypertension     MEDICATIONS:  Prior to Admission medications   Medication Sig Start Date End Date Taking? Authorizing Provider  amLODipine (NORVASC) 10 MG tablet Take 10 mg by mouth daily.   Yes [provider]  hydrochlorothiazide (HYDRODIURIL) 25 MG tablet Take 25 mg by mouth daily.   Yes [provider]  lisinopril (PRINIVIL,ZESTRIL) 40 MG tablet Take 40 mg by mouth daily.     Yes [provider]  benzonatate (TESSALON) 100 MG capsule Take 1 capsule (100 mg total) by mouth every 8 (eight) hours. 11/27/18   Law, Bea Graff, PA-C  cetirizine (ZYRTEC ALLERGY) 10 MG tablet Take 1 tablet (10 mg total) by mouth daily. 11/27/18   Law, Bea Graff, PA-C  fluticasone (FLONASE) 50 MCG/ACT nasal spray Place 1 spray into both nostrils daily. 11/27/18   Law, Bea Graff, PA-C  potassium chloride 20 MEQ TBCR Take 20 mEq by mouth 2 (two) times daily. 05/31/19   Dorie Rank, MD    ALLERGIES:  Allergies  Allergen Reactions  . Penicillins Swelling    SOCIAL HISTORY:  Social History   Tobacco Use  . Smoking status: Former Research scientist (life sciences)  . Smokeless tobacco: Never Used  Substance Use Topics  . Alcohol use: Yes    Comment: occ     FAMILY HISTORY: No family history on file.  EXAM: BP (!) 141/84   Pulse 70   Temp 99.4 F (37.4 C) (Oral)   Resp 20   Ht 5\' 6"  (1.676 m)   Wt (!) 138.3 kg   SpO2 100%   BMI 49.23 kg/m  CONSTITUTIONAL: Alert and oriented and responds appropriately to questions. Well-appearing; well-nourished; GCS 15 HEAD: Normocephalic; atraumatic EYES: Conjunctivae clear, PERRL, EOMI ENT: normal nose; no rhinorrhea; moist mucous membranes; pharynx without lesions noted; no dental injury; no septal hematoma NECK: Supple, no meningismus, no LAD; no midline spinal tenderness, step-off or deformity; trachea midline CARD: RRR; S1 and S2 appreciated; no murmurs, no clicks, no rubs, no gallops RESP: Normal chest excursion without splinting or tachypnea; breath sounds clear and equal bilaterally; no wheezes, no rhonchi, no rales; no hypoxia or respiratory distress CHEST:  chest wall stable, no crepitus or ecchymosis or deformity, nontender to palpation; no flail chest ABD/GI: Normal bowel sounds; non-distended; soft, non-tender, no rebound, no guarding; no ecchymosis or other lesions noted PELVIS:  stable, nontender to palpation BACK:  The back appears normal and is non-tender to palpation, there is no CVA tenderness; no midline spinal tenderness, step-off or deformity EXT: Tender to palpation over the left knee diffusely.  No bony abnormality appreciated.  No joint effusion.  She is able to extend the knee fully without difficulty.  Has significant pain with flexion of the knee but I am able to do so passively.  She has no ligamentous laxity.  2+ DP pulse on exam.  Normal sensation throughout the left leg.  No other tenderness throughout the left leg.  Compartments are soft.  Otherwise extremities are nontender. SKIN: Normal color for age and race; warm NEURO: Moves all extremities equally PSYCH: The patient's mood and manner are appropriate. Grooming and personal hygiene are appropriate.  MEDICAL  DECISION MAKING: Patient here after she had a mechanical fall.  Suspect knee contusion, possible sprain.  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  Will place a knee sleeve and provide crutches.  Recommended ice, elevation, ibuprofen.  Patient feels she will need something stronger for pain.  Unable to tolerate hydrocodone but states she has done well with oxycodone.  Will discharge with short prescription of the same.  Discussed return precautions.  Given outpatient orthopedic follow-up if symptoms or not improving with medical management.  At this time, I do not feel there is any life-threatening condition present. I have reviewed and discussed all results (EKG, imaging, lab, urine as appropriate) and exam findings with patient/family. I have reviewed nursing notes and appropriate previous records.  I feel the patient is safe to be discharged home without further emergent workup and can continue workup as an outpatient as needed. Discussed usual and customary return precautions. Patient/family verbalize understanding and are comfortable with this plan.  Outpatient follow-up has been provided as needed. All questions have been answered.    Sheila Carlson was evaluated in Emergency Department on 10/24/2019 for the symptoms described in the history of present illness. She was evaluated in the context of the global COVID-19 pandemic, which necessitated consideration that the patient might be at risk for infection with the SARS-CoV-2 virus that causes COVID-19. Institutional protocols and algorithms that pertain to the evaluation of patients at risk for COVID-19 are in a state of rapid change based on information released by regulatory bodies including the CDC and federal and state organizations. These policies and algorithms were followed during the patient's care in the ED.     Matalynn Graff, Delice Bison, DO 10/24/19 [phone]

## 2019-10-24 NOTE — Discharge Instructions (Signed)
Your x-ray today showed no acute injury but did show arthritis.  If pain is not improving with medical therapy, please follow-up with orthopedics as an outpatient.

## 2020-04-09 ENCOUNTER — Other Ambulatory Visit: Payer: Self-pay

## 2020-04-13 ENCOUNTER — Other Ambulatory Visit: Payer: Self-pay

## 2020-04-13 ENCOUNTER — Ambulatory Visit (INDEPENDENT_AMBULATORY_CARE_PROVIDER_SITE_OTHER): Payer: 59 | Admitting: Family

## 2020-04-13 ENCOUNTER — Encounter: Payer: Self-pay | Admitting: Family

## 2020-04-13 ENCOUNTER — Ambulatory Visit: Payer: 59 | Admitting: Family

## 2020-04-13 VITALS — BP 155/92 | HR 100 | Temp 97.6°F | Resp 18 | Ht 66.0 in | Wt 319.0 lb

## 2020-04-13 DIAGNOSIS — R7303 Prediabetes: Secondary | ICD-10-CM

## 2020-04-13 DIAGNOSIS — E669 Obesity, unspecified: Secondary | ICD-10-CM | POA: Diagnosis not present

## 2020-04-13 DIAGNOSIS — E785 Hyperlipidemia, unspecified: Secondary | ICD-10-CM | POA: Diagnosis not present

## 2020-04-13 DIAGNOSIS — I10 Essential (primary) hypertension: Secondary | ICD-10-CM | POA: Diagnosis not present

## 2020-04-13 DIAGNOSIS — E039 Hypothyroidism, unspecified: Secondary | ICD-10-CM

## 2020-04-13 MED ORDER — METOPROLOL SUCCINATE ER 50 MG PO TB24: 50.0000 mg | ORAL_TABLET | Freq: Every day | ORAL | 1 refills | Status: DC

## 2020-04-13 NOTE — Patient Instructions (Addendum)
Please begin toprol xl 50mg  once daily for blood pressure. Continue your other blood pressure medications. Welcome to [facility]!

## 2020-04-13 NOTE — Progress Notes (Signed)
Subjective:    Patient ID: Sheila Carlson, female    DOB: [birth year], 59 y.o.   MRN: [id number]  HPI  Patient is a 59 yr old female who presents today to establish care.   pmhx is significant for the following:  Borderline diabetes-reports that she has been borderline for "a good while."  HTN- maintained on amlodipine, hctz and lisinopril. Reports  BP Readings from Last 3 Encounters:  04/13/20 (!) 155/92  10/24/19 (!) 162/92  05/31/19 (!) 160/79   Hx of Hypothyroid- not currently on synthroid. Per chart review- pt is unaware of this.    Hyperlipidemia-not on statin.   Obesity- interested in losing weight.  Wt Readings from Last 3 Encounters:  04/13/20 (!) 319 lb (144.7 kg)  10/23/19 (!) 305 lb (138.3 kg)  05/31/19 (!) 308 lb 10.3 oz (140 kg)   GERD- Intermittent- based on diet.     Review of Systems  Constitutional: Negative for unexpected weight change.  HENT: Negative for hearing loss and rhinorrhea.   Eyes: Negative for visual disturbance.  Respiratory: Negative for cough and shortness of breath.   Cardiovascular: Negative for chest pain.  Gastrointestinal: Negative for constipation and diarrhea.  Genitourinary: Negative for frequency and hematuria.  Musculoskeletal: Negative for arthralgias and myalgias.  Skin: Negative for rash.  Neurological: Negative for headaches.  Hematological: Negative for adenopathy.  Psychiatric/Behavioral:       Denies depression/anxiety   Past Medical History:  Diagnosis Date  . Hypertension      Social History   Socioeconomic History  . Marital status: Married    Spouse name: Not on file  . Number of children: 2  . Years of education: Not on file  . Highest education level: Not on file  Occupational History  . 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 Smoking status: Former Smoker    Types: Cigarettes  . Smokeless tobacco: Never Used  Substance and Sexual Activity  . Alcohol use: Yes    Comment: occ  . Drug  use: No  . Sexual activity: Yes    Partners: Male  Other Topics Concern  . Not on file  Social History Narrative  . Not on file   Social Determinants of Health   Financial Resource Strain:   . Difficulty of Paying Living Expenses:   Food Insecurity:   . Worried About Charity fundraiser in the Last Year:   . Arboriculturist in the Last Year:   Transportation Needs:   . Film/video editor (Medical):   [name] Lack of Transportation (Non-Medical):   Physical Activity: Unknown  . Days of Exercise per Week: 0 days  . Minutes of Exercise per Session: Not on file  Stress:   . Feeling of Stress :   Social Connections:   . Frequency of Communication with Friends and Family:   . Frequency of Social Gatherings with Friends and Family:   . Attends Religious Services:   . Active Member of Clubs or Organizations:   . Attends Archivist Meetings:   [name] Marital Status:   Intimate Partner Violence:   . Fear of Current or Ex-Partner:   . Emotionally Abused:   [name] Physically Abused:   . Sexually Abused:     Past Surgical History:  Procedure Laterality Date  . CESAREAN SECTION      Family History  Problem Relation Age of Onset  . Hypertension Mother   . Diabetes Mother     Allergies  Allergen Reactions  . Hydrocodone     Causes hallucinations.  Patient states she can tolerate oxycodone.  [name] Penicillins Swelling    Current Outpatient Medications on File Prior to Visit  Medication Sig Dispense Refill  . amLODipine (NORVASC) 10 MG tablet Take 10 mg by mouth daily.    . hydrochlorothiazide (HYDRODIURIL) 25 MG tablet Take 25 mg by mouth daily.    [name] lisinopril (PRINIVIL,ZESTRIL) 40 MG tablet Take 40 mg by mouth daily.       No current facility-administered medications on file prior to visit.    BP (!) 155/92 (BP Location: Right Arm, Patient Position: Sitting, Cuff Size: Large)   Pulse 100   Temp 97.6 F (36.4 C) (Temporal)   Resp 18   Ht 5\' 6"  (1.676 m)   Wt (!) 319 lb  (144.7 kg)   SpO2 100%   BMI 51.49 kg/m       Objective:   Physical Exam Constitutional:      Appearance: She is well-developed.  Neck:     Thyroid: No thyromegaly.  Cardiovascular:     Rate and Rhythm: Normal rate and regular rhythm.     Heart sounds: Normal heart sounds. No murmur.  Pulmonary:     Effort: Pulmonary effort is normal. No respiratory distress.     Breath sounds: Normal breath sounds. No wheezing.  Musculoskeletal:     Cervical back: Neck supple.  Skin:    General: Skin is warm and dry.  Neurological:     Mental Status: She is alert and oriented to person, place, and time.  Psychiatric:        Behavior: Behavior normal.        Thought Content: Thought content normal.        Judgment: Judgment normal.           Assessment & Plan:  HTN- bp is elevated. Will add toprol xl once daily. Continue other medications.   Hyperlipidemia-obtain lipid panel.  Borderline DM2- check A1C, cmet.  Morbid obesity- refer to medical weight management clinic.   GERD- stable without medications.  Monitor.   Hypothyroid- check TSH.   This visit occurred during the SARS-CoV-2 public health emergency.  Safety protocols were in place, including screening questions prior to the visit, additional usage of staff PPE, and extensive cleaning of exam room while observing appropriate contact time as indicated for disinfecting solutions.

## 2020-04-14 LAB — COMPREHENSIVE METABOLIC PANEL
ALT: 12 U/L (ref 0–35)
AST: 16 U/L (ref 0–37)
Albumin: 3.8 g/dL (ref 3.5–5.2)
Alkaline Phosphatase: 59 U/L (ref 39–117)
BUN: 13 mg/dL (ref 6–23)
CO2: 30 mEq/L (ref 19–32)
Calcium: 8.9 mg/dL (ref 8.4–10.5)
Chloride: 99 mEq/L (ref 96–112)
Creatinine, Ser: 0.69 mg/dL (ref 0.40–1.20)
GFR: 104.87 mL/min (ref >60.00)
Glucose, Bld: 99 mg/dL (ref 70–99)
Potassium: 3.2 mEq/L — ABNORMAL LOW (ref 3.5–5.1)
Sodium: 138 mEq/L (ref 135–145)
Total Bilirubin: 0.4 mg/dL (ref 0.2–1.2)
Total Protein: 7.4 g/dL (ref 6.0–8.3)

## 2020-04-14 LAB — LIPID PANEL
Cholesterol: 229 mg/dL — ABNORMAL HIGH (ref 0–200)
HDL: 43.1 mg/dL
LDL Cholesterol: 155 mg/dL — ABNORMAL HIGH (ref 0–99)
NonHDL: 185.88
Total CHOL/HDL Ratio: 5
Triglycerides: 152 mg/dL — ABNORMAL HIGH (ref 0.0–149.0)
VLDL: 30.4 mg/dL (ref 0.0–40.0)

## 2020-04-14 LAB — MICROALBUMIN / CREATININE URINE RATIO
Creatinine,U: 90.7 mg/dL
Microalb Creat Ratio: 1.4 mg/g (ref 0.0–30.0)
Microalb, Ur: 1.3 mg/dL (ref 0.0–1.9)

## 2020-04-14 LAB — TSH: TSH: 2.94 u[IU]/mL (ref 0.35–4.50)

## 2020-04-15 ENCOUNTER — Telehealth: Payer: Self-pay | Admitting: Family

## 2020-04-15 DIAGNOSIS — E876 Hypokalemia: Secondary | ICD-10-CM

## 2020-04-15 DIAGNOSIS — E785 Hyperlipidemia, unspecified: Secondary | ICD-10-CM

## 2020-04-16 ENCOUNTER — Encounter: Payer: Self-pay | Admitting: Family

## 2020-04-16 DIAGNOSIS — E785 Hyperlipidemia, unspecified: Secondary | ICD-10-CM

## 2020-04-16 HISTORY — DX: Hyperlipidemia, unspecified: E78.5

## 2020-04-16 MED ORDER — POTASSIUM CHLORIDE CRYS ER 20 MEQ PO TBCR: EXTENDED_RELEASE_TABLET | ORAL | 3 refills | Status: DC

## 2020-04-16 NOTE — Telephone Encounter (Signed)
Also, cholesterol is elevated. She should work on low fat/low cholesterol diet, exercise, weight loss.

## 2020-04-16 NOTE — Telephone Encounter (Signed)
Potassium is low. I would like her to start kdur. Take 2 tabs by mouth today, then one tablet once daily. Repeat bmet in 1 week, dx hypokalemia.

## 2020-04-19 NOTE — Telephone Encounter (Signed)
Pt notified & lab appt scheduled 

## 2020-04-23 NOTE — Addendum Note (Signed)
Addended by: Kelle Darting A on: 04/23/2020 03:45 PM   Modules accepted: Orders

## 2020-04-24 DIAGNOSIS — U071 COVID-19: Secondary | ICD-10-CM

## 2020-04-24 HISTORY — DX: COVID-19: U07.1

## 2020-04-26 ENCOUNTER — Telehealth: Payer: Self-pay | Admitting: Family

## 2020-04-26 ENCOUNTER — Other Ambulatory Visit: Payer: Self-pay

## 2020-04-26 ENCOUNTER — Other Ambulatory Visit (INDEPENDENT_AMBULATORY_CARE_PROVIDER_SITE_OTHER): Payer: 59

## 2020-04-26 DIAGNOSIS — R739 Hyperglycemia, unspecified: Secondary | ICD-10-CM

## 2020-04-26 DIAGNOSIS — E876 Hypokalemia: Secondary | ICD-10-CM

## 2020-04-26 LAB — BASIC METABOLIC PANEL
BUN: 11 mg/dL (ref 6–23)
CO2: 30 mEq/L (ref 19–32)
Calcium: 9.1 mg/dL (ref 8.4–10.5)
Chloride: 105 mEq/L (ref 96–112)
Creatinine, Ser: 0.75 mg/dL (ref 0.40–1.20)
GFR: 95.24 mL/min (ref >60.00)
Glucose, Bld: 162 mg/dL — ABNORMAL HIGH (ref 70–99)
Potassium: 3.6 mEq/L (ref 3.5–5.1)
Sodium: 135 mEq/L (ref 135–145)

## 2020-04-26 NOTE — Telephone Encounter (Signed)
Please advise pt that her sugar is elevated. I would like her to return to the lab for an A1C to evaluate her for diabetes.

## 2020-04-28 NOTE — Telephone Encounter (Signed)
Notified pt and she voices understanding. Lab appt scheduled for 04/29/20 at 10:15am.

## 2020-04-29 ENCOUNTER — Encounter: Payer: Self-pay | Admitting: Family

## 2020-04-29 ENCOUNTER — Telehealth: Payer: Self-pay | Admitting: Family

## 2020-04-29 ENCOUNTER — Other Ambulatory Visit (INDEPENDENT_AMBULATORY_CARE_PROVIDER_SITE_OTHER): Payer: 59

## 2020-04-29 ENCOUNTER — Other Ambulatory Visit: Payer: Self-pay

## 2020-04-29 DIAGNOSIS — E119 Type 2 diabetes mellitus without complications: Secondary | ICD-10-CM

## 2020-04-29 DIAGNOSIS — E669 Obesity, unspecified: Secondary | ICD-10-CM | POA: Insufficient documentation

## 2020-04-29 DIAGNOSIS — R739 Hyperglycemia, unspecified: Secondary | ICD-10-CM

## 2020-04-29 HISTORY — DX: Type 2 diabetes mellitus without complications: E11.9

## 2020-04-29 LAB — HEMOGLOBIN A1C: Hgb A1c MFr Bld: 6.6 % — ABNORMAL HIGH (ref 4.6–6.5)

## 2020-04-29 NOTE — Telephone Encounter (Signed)
Please contact patient and let her know that I reviewed her A1c.  A1c shows that she has diabetes.  It is currently at a level where she should be able to control it with diet and exercise.  I would like for her to schedule an appointment with me sometime in the next month to discuss diabetes management.

## 2020-04-30 NOTE — Telephone Encounter (Signed)
Lvm  for patient to call back about results. 

## 2020-04-30 NOTE — Telephone Encounter (Signed)
Patient advised of results and providers advise. She has an appointment later this month

## 2020-05-11 ENCOUNTER — Encounter: Payer: Self-pay | Admitting: Family

## 2020-05-11 ENCOUNTER — Other Ambulatory Visit: Payer: Self-pay

## 2020-05-11 ENCOUNTER — Other Ambulatory Visit (HOSPITAL_COMMUNITY)
Admission: RE | Admit: 2020-05-11 | Discharge: 2020-05-11 | Disposition: A | Discharge status: Home / Self Care | Payer: 59 | Source: Ambulatory Visit | Attending: Family | Admitting: Family

## 2020-05-11 ENCOUNTER — Ambulatory Visit: Payer: 59 | Admitting: Family

## 2020-05-11 ENCOUNTER — Ambulatory Visit (INDEPENDENT_AMBULATORY_CARE_PROVIDER_SITE_OTHER): Payer: 59 | Admitting: Family

## 2020-05-11 ENCOUNTER — Telehealth: Payer: Self-pay | Admitting: Family

## 2020-05-11 VITALS — BP 129/62 | HR 57 | Temp 97.0°F | Resp 16 | Ht 66.0 in | Wt 314.0 lb

## 2020-05-11 DIAGNOSIS — E119 Type 2 diabetes mellitus without complications: Secondary | ICD-10-CM

## 2020-05-11 DIAGNOSIS — Z0000 Encounter for general adult medical examination without abnormal findings: Secondary | ICD-10-CM

## 2020-05-11 DIAGNOSIS — I10 Essential (primary) hypertension: Secondary | ICD-10-CM

## 2020-05-11 DIAGNOSIS — Z23 Encounter for immunization: Secondary | ICD-10-CM | POA: Diagnosis not present

## 2020-05-11 DIAGNOSIS — E348 Other specified endocrine disorders: Secondary | ICD-10-CM

## 2020-05-11 DIAGNOSIS — E785 Hyperlipidemia, unspecified: Secondary | ICD-10-CM

## 2020-05-11 DIAGNOSIS — Z01419 Encounter for gynecological examination (general) (routine) without abnormal findings: Secondary | ICD-10-CM

## 2020-05-11 MED ORDER — METOPROLOL SUCCINATE ER 50 MG PO TB24: 50.0000 mg | ORAL_TABLET | Freq: Every day | ORAL | 1 refills | Status: DC

## 2020-05-11 MED ORDER — ATORVASTATIN CALCIUM 20 MG PO TABS: 20.0000 mg | ORAL_TABLET | Freq: Every day | ORAL | 1 refills | Status: DC

## 2020-05-11 NOTE — Progress Notes (Signed)
Subjective:    Patient ID: Sheila Carlson, female    DOB: [birth year], 59 y.o.   MRN: [id number]  HPI  59 yr old female presents today for complete physical.  Patient presents today for complete physical.  Immunizations: unsure, needs tdap. And shingrix. Wishes to defer pneumovax.  Diet: will start at weight loss clinic Exercise:walking 3 times a week.  BP Readings from Last 3 Encounters:  05/11/20 129/62  04/13/20 (!) 155/92  10/24/19 (!) 162/92  Colonoscopy: due Dexa: due Vision:  Last year.  She goes to [facility] Pap Smear: 5 years ago, would like to complete today Mammogram: due Dental: up to date.   HTN- Last visit we added toprol xl once daily to her regimen.  BP Readings from Last 3 Encounters:  05/11/20 129/62  04/13/20 (!) 155/92  10/24/19 (!) 162/92    DM2-  Lab Results  Component Value Date   HGBA1C 6.6 (H) 04/29/2020   Lab Results  Component Value Date   MICROALBUR 1.3 04/13/2020   LDLCALC 155 (H) 04/13/2020   CREATININE 0.75 04/26/2020    Hyperlipidemia- Not currently on statin.  Lab Results  Component Value Date   CHOL 229 (H) 04/13/2020   HDL 43.10 04/13/2020   LDLCALC 155 (H) 04/13/2020   TRIG 152.0 (H) 04/13/2020   CHOLHDL 5 04/13/2020      Review of Systems  Constitutional: Negative for unexpected weight change.  HENT: Negative for hearing loss and rhinorrhea.   Eyes: Negative for visual disturbance.  Respiratory: Negative for cough and shortness of breath.   Cardiovascular: Negative for chest pain.  Gastrointestinal: Negative for constipation and diarrhea.  Genitourinary: Negative for dysuria, frequency and hematuria.  Musculoskeletal: Negative for arthralgias and myalgias.  Neurological: Negative for headaches.  Hematological: Negative for adenopathy.  Psychiatric/Behavioral:       Denies depression/anxiety       Past Medical History:  Diagnosis Date  . COVID-19 04/2020  . Diabetes type 2, controlled ([place]) 04/29/2020  .  Hyperlipidemia 04/16/2020  . Hypertension      Social History   Socioeconomic History  . Marital status: Married    Spouse name: Not on file  . Number of children: 2  . Years of education: Not on file  . Highest education level: Not on file  Occupational History  . 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 Smoking status: Former Smoker    Packs/day: 0.50    Years: 2.00    Pack years: 1.00    Types: Cigarettes  . Smokeless tobacco: Never Used  Substance and Sexual Activity  . Alcohol use: Yes    Comment: occ  . Drug use: No  . Sexual activity: Yes    Partners: Male  Other Topics Concern  . Not on file  Social History Narrative   Patient has 2 biological sons   2 adopted children (son, daughter)   Hotel manager   Married (second married)   Completed bachelors in early childhood   No pets   Enjoys Corporate investment banker booking, decorating, shopping   Social Determinants of Health   Financial Resource Strain:   . Difficulty of Paying Living Expenses:   Food Insecurity:   . Worried About Charity fundraiser in the Last Year:   . Arboriculturist in the Last Year:   Transportation Needs:   . Film/video editor (Medical):   [name] Lack of Transportation (Non-Medical):   Physical Activity: Unknown  . Days of Exercise  per Week: 0 days  . Minutes of Exercise per Session: Not on file  Stress:   . Feeling of Stress :   Social Connections:   . Frequency of Communication with Friends and Family:   . Frequency of Social Gatherings with Friends and Family:   . Attends Religious Services:   . Active Member of Clubs or Organizations:   . Attends Archivist Meetings:   [name] Marital Status:   Intimate Partner Violence:   . Fear of Current or Ex-Partner:   . Emotionally Abused:   [name] Physically Abused:   . Sexually Abused:     Past Surgical History:  Procedure Laterality Date  . CESAREAN SECTION      Family History  Problem Relation Age of Onset  . Hypertension Mother     . Diabetes Mother   . Alcohol abuse Father   . Asthma Sister   . Colitis Sister        pt unsure    Allergies  Allergen Reactions  . Hydrocodone     Causes hallucinations.  Patient states she can tolerate oxycodone.  [name] Penicillins Swelling    Current Outpatient Medications on File Prior to Visit  Medication Sig Dispense Refill  . amLODipine (NORVASC) 10 MG tablet Take 10 mg by mouth daily.    . hydrochlorothiazide (HYDRODIURIL) 25 MG tablet Take 25 mg by mouth daily.    [name] lisinopril (PRINIVIL,ZESTRIL) 40 MG tablet Take 40 mg by mouth daily.      . potassium chloride SA (KLOR-CON) 20 MEQ tablet Take 1 tablet by mouth once daily. 30 tablet 3   No current facility-administered medications on file prior to visit.    BP 129/62 (BP Location: Right Arm, Patient Position: Sitting, Cuff Size: Large)   Pulse (!) 57   Temp (!) 97 F (36.1 C) (Temporal)   Resp 16   Ht 5\' 6"  (1.676 m)   Wt (!) 314 lb (142.4 kg)   SpO2 100%   BMI 50.68 kg/m    Objective:   Physical Exam Physical Exam  Constitutional: She is oriented to person, place, and time. She appears well-developed and well-nourished. No distress.  HENT:  Head: Normocephalic and atraumatic.  Right Ear: Tympanic membrane and ear canal normal.  Left Ear: Tympanic membrane and ear canal normal.  Mouth/Throat: not examined Eyes: Pupils are equal, round, and reactive to light. No scleral icterus.  Neck: Normal range of motion. No thyromegaly present.  Cardiovascular: Normal rate and regular rhythm.   No murmur heard. Pulmonary/Chest: Effort normal and breath sounds normal. No respiratory distress. He has no wheezes. She has no rales. She exhibits no tenderness.  Abdominal: Soft. Bowel sounds are normal. She exhibits no distension and no mass. There is no tenderness. There is no rebound and no guarding.  Musculoskeletal: She exhibits no edema.  Lymphadenopathy:    She has no cervical adenopathy.  Neurological: She is alert  and oriented to person, place, and time. She has normal patellar reflexes. She exhibits normal muscle tone. Coordination normal.  Skin: Skin is warm and dry.  Psychiatric: She has a normal mood and affect. Her behavior is normal. Judgment and thought content normal.  Breasts: Examined lying Right: Without masses, retractions, discharge or axillary adenopathy.  Left: Without masses, retractions, discharge or axillary adenopathy.  Inguinal/mons: Normal without inguinal adenopathy  External genitalia: Normal  BUS/Urethra/Skene's glands: Normal  Bladder: Normal  Vagina: Normal  Cervix: Normal  Uterus: normal in size, shape and contour. Midline  and mobile  Adnexa/parametria:  Rt: Without masses or tenderness.  Lt: Without masses or tenderness.  Anus and perineum: Normal            Assessment & Plan:   Preventative care- encouraged pt to continue to work on healthy diet, exercise, weight loss. Refer for colo, mammo. Pap performed today. Tdap and Shingrix #1 today. Plan pneumovax 23 and shingrix #2 at next visit in 2 months.   Hyperlipidemia- LDL above goal. Will intiate atorvastatin.  DM2- discussed diabetic diet.  HTN- bp is significantly improved since she started toprol xl. Continue current meds/doses.  This visit occurred during the SARS-CoV-2 public health emergency.  Safety protocols were in place, including screening questions prior to the visit, additional usage of staff PPE, and extensive cleaning of exam room while observing appropriate contact time as indicated for disinfecting solutions.          Assessment & Plan:

## 2020-05-11 NOTE — Telephone Encounter (Signed)
Please request DM eye exam from [facility] off of [address] in [place].

## 2020-05-11 NOTE — Patient Instructions (Addendum)
Please start atorvastatin 59m.   Preventive Care [id number] Old, Female 59 Preventive care refers to visits with your health care provider and lifestyle choices that can promote health and wellness. This includes:  A yearly physical exam. This may also be called an annual well check.  Regular dental visits and eye exams.  Immunizations.  Screening for certain conditions.  Healthy lifestyle choices, such as eating a healthy diet, getting regular exercise, not using drugs or products that contain nicotine and tobacco, and limiting alcohol use. What can I expect for my preventive care visit? Physical exam Your health care provider will check your:  Height and weight. This may be used to calculate body mass index (BMI), which tells if you are at a healthy weight.  Heart rate and blood pressure.  Skin for abnormal spots. Counseling Your health care provider may ask you questions about your:  Alcohol, tobacco, and drug use.  Emotional well-being.  Home and relationship well-being.  Sexual activity.  Eating habits.  Work and work eStatistician  Method of birth control.  Menstrual cycle.  Pregnancy history. What immunizations do I need?  Influenza (flu) vaccine  This is recommended every year. Tetanus, diphtheria, and pertussis (Tdap) vaccine  You may need a Td booster every 10 years. Varicella (chickenpox) vaccine  You may need this if you have not been vaccinated. Zoster (shingles) vaccine  You may need this after age 61 Measles, mumps, and rubella (MMR) vaccine  You may need at least one dose of MMR if you were born in 1957 or later. You may also need a second dose. Pneumococcal conjugate (PCV13) vaccine  You may need this if you have certain conditions and were not previously vaccinated. Pneumococcal polysaccharide (PPSV23) vaccine  You may need one or two doses if you smoke cigarettes or if you have certain conditions. Meningococcal conjugate (MenACWY)  vaccine  You may need this if you have certain conditions. Hepatitis A vaccine  You may need this if you have certain conditions or if you travel or work in places where you may be exposed to hepatitis A. Hepatitis B vaccine  You may need this if you have certain conditions or if you travel or work in places where you may be exposed to hepatitis B. Haemophilus influenzae type b (Hib) vaccine  You may need this if you have certain conditions. Human papillomavirus (HPV) vaccine  If recommended by your health care provider, you may need three doses over 6 months. You may receive vaccines as individual doses or as more than one vaccine together in one shot (combination vaccines). Talk with your health care provider about the risks and benefits of combination vaccines. What tests do I need? Blood tests  Lipid and cholesterol levels. These may be checked every 5 years, or more frequently if you are over 529years old.  Hepatitis C test.  Hepatitis B test. Screening  Lung cancer screening. You may have this screening every year starting at age 6914if you have a 30-pack-year history of smoking and currently smoke or have quit within the past 15 years.  Colorectal cancer screening. All adults should have this screening starting at age 6961and continuing until age 75997 Your health care provider may recommend screening at age 7524if you are at increased risk. You will have tests every 1-10 years, depending on your results and the type of screening test.  Diabetes screening. This is done by checking your blood sugar (glucose) after you have not eaten for a  while (fasting). You may have this done every 1-3 years.  Mammogram. This may be done every 1-2 years. Talk with your health care provider about when you should start having regular mammograms. This may depend on whether you have a family history of breast cancer.  BRCA-related cancer screening. This may be done if you have a family history of  breast, ovarian, tubal, or peritoneal cancers.  Pelvic exam and Pap test. This may be done every 3 years starting at age 32. Starting at age 86, this may be done every 5 years if you have a Pap test in combination with an HPV test. Other tests  Sexually transmitted disease (STD) testing.  Bone density scan. This is done to screen for osteoporosis. You may have this scan if you are at high risk for osteoporosis. Follow these instructions at home: Eating and drinking  Eat a diet that includes fresh fruits and vegetables, whole grains, lean protein, and low-fat dairy.  Take vitamin and mineral supplements as recommended by your health care provider.  Do not drink alcohol if: ? Your health care provider tells you not to drink. ? You are pregnant, may be pregnant, or are planning to become pregnant.  If you drink alcohol: ? Limit how much you have to 0-1 drink a day. ? Be aware of how much alcohol is in your drink. In the [place] one drink equals one 12 oz bottle of beer (355 mL), one 5 oz glass of wine (148 mL), or one 1 oz glass of hard liquor (44 mL). Lifestyle  Take daily care of your teeth and gums.  Stay active. Exercise for at least 30 minutes on 5 or more days each week.  Do not use any products that contain nicotine or tobacco, such as cigarettes, e-cigarettes, and chewing tobacco. If you need help quitting, ask your health care provider.  If you are sexually active, practice safe sex. Use a condom or other form of birth control (contraception) in order to prevent pregnancy and STIs (sexually transmitted infections).  If told by your health care provider, take low-dose aspirin daily starting at age 34. What's next?  Visit your health care provider once a year for a well check visit.  Ask your health care provider how often you should have your eyes and teeth checked.  Stay up to date on all vaccines. This information is not intended to replace advice given to you by your  health care provider. Make sure you discuss any questions you have with your health care provider. Document Revised: 08/22/2018 Document Reviewed: 08/22/2018 Elsevier Patient Education  2020 [employer].

## 2020-05-12 ENCOUNTER — Other Ambulatory Visit: Payer: Self-pay

## 2020-05-12 ENCOUNTER — Ambulatory Visit (INDEPENDENT_AMBULATORY_CARE_PROVIDER_SITE_OTHER): Payer: 59 | Admitting: Bariatrics

## 2020-05-12 ENCOUNTER — Encounter (INDEPENDENT_AMBULATORY_CARE_PROVIDER_SITE_OTHER): Payer: Self-pay | Admitting: Bariatrics

## 2020-05-12 VITALS — BP 131/82 | HR 57 | Temp 98.0°F | Ht 67.0 in | Wt 311.0 lb

## 2020-05-12 DIAGNOSIS — I10 Essential (primary) hypertension: Secondary | ICD-10-CM | POA: Diagnosis not present

## 2020-05-12 DIAGNOSIS — E119 Type 2 diabetes mellitus without complications: Secondary | ICD-10-CM

## 2020-05-12 DIAGNOSIS — Z0289 Encounter for other administrative examinations: Secondary | ICD-10-CM

## 2020-05-12 DIAGNOSIS — Z9189 Other specified personal risk factors, not elsewhere classified: Secondary | ICD-10-CM

## 2020-05-12 DIAGNOSIS — R0602 Shortness of breath: Secondary | ICD-10-CM

## 2020-05-12 DIAGNOSIS — R5383 Other fatigue: Secondary | ICD-10-CM | POA: Diagnosis not present

## 2020-05-12 DIAGNOSIS — Z1331 Encounter for screening for depression: Secondary | ICD-10-CM | POA: Diagnosis not present

## 2020-05-12 DIAGNOSIS — Z6841 Body Mass Index (BMI) 40.0 and over, adult: Secondary | ICD-10-CM

## 2020-05-12 DIAGNOSIS — K219 Gastro-esophageal reflux disease without esophagitis: Secondary | ICD-10-CM

## 2020-05-12 DIAGNOSIS — E559 Vitamin D deficiency, unspecified: Secondary | ICD-10-CM

## 2020-05-12 LAB — CYTOLOGY - PAP
Adequacy: ABSENT
Comment: NEGATIVE
Diagnosis: NEGATIVE
High risk HPV: NEGATIVE

## 2020-05-13 LAB — INSULIN, RANDOM: INSULIN: 11.9 u[IU]/mL (ref 2.6–24.9)

## 2020-05-13 LAB — CBC WITH DIFFERENTIAL/PLATELET
Basophils Absolute: 0 10*3/uL (ref 0.0–0.2)
Basos: 1 %
EOS (ABSOLUTE): 0.5 10*3/uL — ABNORMAL HIGH (ref 0.0–0.4)
Eos: 10 %
Hematocrit: 40.4 % (ref 34.0–46.6)
Hemoglobin: 13.1 g/dL (ref 11.1–15.9)
Immature Grans (Abs): 0 10*3/uL (ref 0.0–0.1)
Immature Granulocytes: 0 %
Lymphocytes Absolute: 1.6 10*3/uL (ref 0.7–3.1)
Lymphs: 30 %
MCH: 28.1 pg (ref 26.6–33.0)
MCHC: 32.4 g/dL (ref 31.5–35.7)
MCV: 87 fL (ref 79–97)
Monocytes Absolute: 0.3 10*3/uL (ref 0.1–0.9)
Monocytes: 6 %
Neutrophils Absolute: 2.8 10*3/uL (ref 1.4–7.0)
Neutrophils: 53 %
Platelets: 373 10*3/uL (ref 150–450)
RBC: 4.66 x10E6/uL (ref 3.77–5.28)
RDW: 14.2 % (ref 11.7–15.4)
WBC: 5.2 10*3/uL (ref 3.4–10.8)

## 2020-05-13 LAB — VITAMIN D 25 HYDROXY (VIT D DEFICIENCY, FRACTURES): Vit D, 25-Hydroxy: 30.3 ng/mL (ref 30.0–100.0)

## 2020-05-13 NOTE — Telephone Encounter (Signed)
Records release faxed to [facility]

## 2020-05-13 NOTE — Progress Notes (Signed)
Dear Sheila Alar, NP,   Thank you for referring Sheila Carlson to our clinic. The following note includes my evaluation and treatment recommendations.  Chief Complaint:   OBESITY Sheila Carlson (MR# [id number]) is a 59 y.o. female who presents for evaluation and treatment of obesity and related comorbidities. Current BMI is Body mass index is 48.71 kg/m. Sheila Carlson has been struggling with her weight for many years and has been unsuccessful in either losing weight, maintaining weight loss, or reaching her healthy weight goal.  Sheila Carlson is currently in the action stage of change and ready to dedicate time achieving and maintaining a healthier weight. Sheila Carlson is interested in becoming our patient and working on intensive lifestyle modifications including (but not limited to) diet and exercise for weight loss.  Sheila Carlson considers herself a picky eater.  She states junk food is her greatest weakness.  Sheila Carlson's habits were reviewed today and are as follows: Her family eats meals together, she thinks her family will eat healthier with her, her desired weight loss is 112 pounds, she has been heavy most of her life, she started gaining weight after having children, her heaviest weight ever was her current weight, she is a picky eater, she craves carbs, sweets, and fast food, she snacks frequently in the evenings and she struggles with emotional eating.  Depression Screen Sheila Carlson's Food and Mood (modified PHQ-9) score was 4.  Depression screen [facility] 2/9 05/12/2020  Decreased Interest 0  Down, Depressed, Hopeless 0  PHQ - 2 Score 0  Altered sleeping 0  Tired, decreased energy 3  Change in appetite 1  Feeling bad or failure about yourself  0  Trouble concentrating 0  Moving slowly or fidgety/restless 0  Suicidal thoughts 0  PHQ-9 Score 4  Difficult doing work/chores Not difficult at all   Subjective:   1. Other fatigue Sheila Carlson denies daytime somnolence and denies waking up still tired.  Patent has a history of symptoms of snoring. Sheila Carlson generally gets 10 hours of sleep per night, and states that she has generally restful sleep. Snoring is present. Apneic episodes are not present. Epworth Sleepiness Score is 8.  2. SOB (shortness of breath) on exertion Sheila Carlson notes increasing shortness of breath with exercising and seems to be worsening over time with weight gain. She notes getting out of breath sooner with activity than she used to. This has not gotten worse recently. Sheila Carlson denies shortness of breath at rest or orthopnea.  3. Essential hypertension Review: taking medications as instructed, no medication side effects noted, no chest pain on exertion, no dyspnea on exertion, no swelling of ankles.  She is taking HCTZ, metoprolol, lisinopril, and amlodipine.  Blood pressure is reasonably controlled.  BP Readings from Last 3 Encounters:  05/12/20 131/82  05/11/20 129/62  04/13/20 (!) 155/92   4. Gastroesophageal reflux disease, unspecified whether esophagitis present Sheila Carlson takes no medications for GERD.  5. Type 2 diabetes mellitus without complication, without long-term current use of insulin (HCC) Sheila Carlson is on no medications for type 2 diabetes.  Lab Results  Component Value Date   HGBA1C 6.6 (H) 04/29/2020   Lab Results  Component Value Date   MICROALBUR 1.3 04/13/2020   LDLCALC 155 (H) 04/13/2020   CREATININE 0.75 04/26/2020   Lab Results  Component Value Date   INSULIN 11.9 05/12/2020   6. Vitamin D deficiency She is currently taking OTC vitamin D. She denies nausea, vomiting or muscle weakness.  7. Depression screening Gunhild was  screened for depression as part of her new patient workup.  8. At risk for heart disease [name] is at a higher than average risk for cardiovascular disease due to obesity and her diagnosis of hypertension.   Assessment/Plan:   1. Other fatigue Sheila Carlson does not feel that her weight is causing her energy to be lower than it should be.  Fatigue may be related to obesity, depression or many other causes. Labs will be ordered, and in the meanwhile, Sheila Carlson will focus on self care including making healthy food choices, increasing physical activity and focusing on stress reduction. - EKG 12-Lead  2. SOB (shortness of breath) on exertion Sheila Carlson does not feel that she gets out of breath more easily that she used to when she exercises. Sheila Carlson's shortness of breath appears to be obesity related and exercise induced. She has agreed to work on weight loss and gradually increase exercise to treat her exercise induced shortness of breath. Will continue to monitor closely. - CBC with Differential/Platelet  3. Essential hypertension Sheila Carlson is working on healthy weight loss and exercise to improve blood pressure control. We will watch for signs of hypotension as she continues her lifestyle modifications.  4. Gastroesophageal reflux disease, unspecified whether esophagitis present Intensive lifestyle modifications are the first line treatment for this issue. We discussed several lifestyle modifications today and she will continue to work on diet, exercise and weight loss efforts. Orders and follow up as documented in patient record.   Counseling . If a person has gastroesophageal reflux disease (GERD), food and stomach acid move back up into the esophagus and cause symptoms or problems such as damage to the esophagus. . Anti-reflux measures include: raising the head of the bed, avoiding tight clothing or belts, avoiding eating late at night, not lying down shortly after mealtime, and achieving weight loss. . Avoid ASA, NSAID's, caffeine, alcohol, and tobacco.  . OTC Pepcid and/or Tums are often very helpful for as needed use.  Sheila Carlson However, for persisting chronic or daily symptoms, stronger medications like Omeprazole may be needed. . You may need to avoid foods and drinks such as: ? Coffee and tea (with or without caffeine). ? Drinks that contain  alcohol. ? Energy drinks and sports drinks. ? Bubbly (carbonated) drinks or sodas. ? Chocolate and cocoa. ? Peppermint and mint flavorings. ? Garlic and onions. ? Horseradish. ? Spicy and acidic foods. These include peppers, chili powder, curry powder, vinegar, hot sauces, and BBQ sauce. ? Citrus fruit juices and citrus fruits, such as oranges, lemons, and limes. ? Tomato-based foods. These include red sauce, chili, salsa, and pizza with red sauce. ? Fried and fatty foods. These include donuts, french fries, potato chips, and high-fat dressings. ? High-fat meats. These include hot dogs, rib eye steak, sausage, ham, and bacon.  5. Type 2 diabetes mellitus without complication, without long-term current use of insulin (HCC) Good blood sugar control is important to decrease the likelihood of diabetic complications such as nephropathy, neuropathy, limb loss, blindness, coronary artery disease, and death. Intensive lifestyle modification including diet, exercise and weight loss are the first line of treatment for diabetes.  - Insulin, random  6. Vitamin D deficiency Low Vitamin D level contributes to fatigue and are associated with obesity, breast, and colon cancer.  - VITAMIN D 25 Hydroxy (Vit-D Deficiency, Fractures)  7. Depression screening Depression screen was negative today.  PHQ-9 was 4.  8. At risk for heart disease Adley was given approximately 15 minutes of coronary artery disease prevention  counseling today. She is 59 y.o. female and has risk factors for heart disease including obesity. We discussed intensive lifestyle modifications today with an emphasis on specific weight loss instructions and strategies.   Repetitive spaced learning was employed today to elicit superior memory formation and behavioral change.  9. Class 3 severe obesity with serious comorbidity and body mass index (BMI) of 45.0 to 49.9 in adult, unspecified obesity type [facility]) [place] is currently in the action  stage of change and her goal is to continue with weight loss efforts. I recommend Riyanshi begin the structured treatment plan as follows:  She has agreed to the Category 3 Plan.  Sagal will work on meal planning.  Reviewed labs from 04/26/2020 (BMP) and from 04/29/2020 (A1c=6.6).  Exercise goals: No exercise has been prescribed at this time.   Behavioral modification strategies: increasing lean protein intake, decreasing simple carbohydrates, increasing vegetables, increasing water intake, decreasing eating out, no skipping meals, meal planning and cooking strategies, keeping healthy foods in the home and planning for success.  She was informed of the importance of frequent follow-up visits to maximize her success with intensive lifestyle modifications for her multiple health conditions. She was informed we would discuss her lab results at her next visit unless there is a critical issue that needs to be addressed sooner. Glendoris agreed to keep her next visit at the agreed upon time to discuss these results.  Objective:   Blood pressure 131/82, pulse (!) 57, temperature 98 F (36.7 C), height 5\' 7"  (1.702 m), weight (!) 311 lb (141.1 kg), SpO2 100 %. Body mass index is 48.71 kg/m.  EKG: Normal sinus rhythm, rate 55 bpm.  Indirect Calorimeter completed today shows a VO2 of 279 and a REE of 1948.  Her calculated basal metabolic rate is 0000000 thus her basal metabolic rate is worse than expected.  General: Cooperative, alert, well developed, in no acute distress. HEENT: Conjunctivae and lids unremarkable. Cardiovascular: Regular rhythm.  Lungs: Normal work of breathing. Neurologic: No focal deficits.   Lab Results  Component Value Date   CREATININE 0.75 04/26/2020   BUN 11 04/26/2020   NA 135 04/26/2020   K 3.6 04/26/2020   CL 105 04/26/2020   CO2 30 04/26/2020   Lab Results  Component Value Date   ALT 12 04/13/2020   AST 16 04/13/2020   ALKPHOS 59 04/13/2020   BILITOT 0.4 04/13/2020    Lab Results  Component Value Date   HGBA1C 6.6 (H) 04/29/2020   Lab Results  Component Value Date   INSULIN 11.9 05/12/2020   Lab Results  Component Value Date   TSH 2.94 04/13/2020   Lab Results  Component Value Date   CHOL 229 (H) 04/13/2020   HDL 43.10 04/13/2020   LDLCALC 155 (H) 04/13/2020   TRIG 152.0 (H) 04/13/2020   CHOLHDL 5 04/13/2020   Lab Results  Component Value Date   WBC 5.2 05/12/2020   HGB 13.1 05/12/2020   HCT 40.4 05/12/2020   MCV 87 05/12/2020   PLT 373 05/12/2020   Attestation Statements:   Reviewed by clinician on day of visit: allergies, medications, problem list, medical history, surgical history, family history, social history, and previous encounter notes.  I, Water quality scientist, CMA, am acting as Location manager for [employer], DO.  I have reviewed the above documentation for accuracy and completeness, and I agree with the above. [name], DO

## 2020-05-17 ENCOUNTER — Encounter (INDEPENDENT_AMBULATORY_CARE_PROVIDER_SITE_OTHER): Payer: Self-pay | Admitting: Bariatrics

## 2020-05-18 ENCOUNTER — Other Ambulatory Visit: Payer: Self-pay

## 2020-05-18 ENCOUNTER — Ambulatory Visit (HOSPITAL_BASED_OUTPATIENT_CLINIC_OR_DEPARTMENT_OTHER)
Admission: RE | Admit: 2020-05-18 | Discharge: 2020-05-18 | Disposition: A | Discharge status: Home / Self Care | Payer: 59 | Source: Ambulatory Visit | Attending: Family | Admitting: Family

## 2020-05-18 DIAGNOSIS — Z0000 Encounter for general adult medical examination without abnormal findings: Secondary | ICD-10-CM | POA: Diagnosis present

## 2020-05-18 DIAGNOSIS — E348 Other specified endocrine disorders: Secondary | ICD-10-CM | POA: Insufficient documentation

## 2020-05-19 ENCOUNTER — Ambulatory Visit (INDEPENDENT_AMBULATORY_CARE_PROVIDER_SITE_OTHER): Payer: Self-pay | Admitting: Family Medicine

## 2020-05-19 ENCOUNTER — Telehealth (INDEPENDENT_AMBULATORY_CARE_PROVIDER_SITE_OTHER): Payer: 59 | Admitting: Family

## 2020-05-19 DIAGNOSIS — R05 Cough: Secondary | ICD-10-CM

## 2020-05-19 MED ORDER — BENZONATATE 100 MG PO CAPS: 100.0000 mg | ORAL_CAPSULE | Freq: Three times a day (TID) | ORAL | 0 refills | Status: DC | PRN

## 2020-05-19 NOTE — Progress Notes (Signed)
Virtual Visit via Video Note  I connected with Sheila Carlson on 05/19/20 at 12:40 PM EDT by a video enabled telemedicine application and verified that I am speaking with the correct person using two identifiers.  Location: Patient: home Provider: work   I discussed the limitations of evaluation and management by telemedicine and the availability of in person appointments. The patient expressed understanding and agreed to proceed.  History of Present Illness:  Patient is a 59 yr old female who presents today with report of cough started Saturday night. Denies fever.  Cough is mostly dry. She denies nasal congestion. Denies sore throat. Completed pfizer vaccine series. Denies loss of taste or smell.  She has tried nyquil, reports that this helped a little bit.    Observations/Objective:   Gen: Awake, alert, no acute distress Resp: Breathing is even and non-labored Psych: calm/pleasant demeanor Neuro: Alert and Oriented x 3, + facial symmetry, speech is clear.   Assessment and Plan:  Cough- likely viral in nature. Advised pt to add prn tessalon. Call if symptoms worsen or if not improved in 3-4 days.  Pt verbalizes understanding.   Follow Up Instructions:    I discussed the assessment and treatment plan with the patient. The patient was provided an opportunity to ask questions and all were answered. The patient agreed with the plan and demonstrated an understanding of the instructions.   The patient was advised to call back or seek an in-person evaluation if the symptoms worsen or if the condition fails to improve as anticipated.  Nance Pear, NP

## 2020-05-26 ENCOUNTER — Ambulatory Visit (INDEPENDENT_AMBULATORY_CARE_PROVIDER_SITE_OTHER): Payer: 59 | Admitting: Bariatrics

## 2020-05-26 ENCOUNTER — Other Ambulatory Visit: Payer: Self-pay

## 2020-05-26 ENCOUNTER — Encounter (INDEPENDENT_AMBULATORY_CARE_PROVIDER_SITE_OTHER): Payer: Self-pay | Admitting: Bariatrics

## 2020-05-26 VITALS — BP 138/82 | HR 63 | Temp 97.8°F | Ht 67.0 in | Wt 311.0 lb

## 2020-05-26 DIAGNOSIS — F3289 Other specified depressive episodes: Secondary | ICD-10-CM

## 2020-05-26 DIAGNOSIS — Z6841 Body Mass Index (BMI) 40.0 and over, adult: Secondary | ICD-10-CM

## 2020-05-26 DIAGNOSIS — E119 Type 2 diabetes mellitus without complications: Secondary | ICD-10-CM | POA: Diagnosis not present

## 2020-05-26 DIAGNOSIS — Z9189 Other specified personal risk factors, not elsewhere classified: Secondary | ICD-10-CM | POA: Diagnosis not present

## 2020-05-26 DIAGNOSIS — E559 Vitamin D deficiency, unspecified: Secondary | ICD-10-CM

## 2020-05-26 MED ORDER — BUPROPION HCL ER (SR) 150 MG PO TB12: 150.0000 mg | ORAL_TABLET | Freq: Every day | ORAL | 0 refills | Status: DC

## 2020-05-26 MED ORDER — VITAMIN D (ERGOCALCIFEROL) 1.25 MG (50000 UNIT) PO CAPS: 50000.0000 [IU] | ORAL_CAPSULE | ORAL | 0 refills | Status: DC

## 2020-05-26 NOTE — Progress Notes (Signed)
Chief Complaint:   OBESITY Sheila Carlson is here to discuss her progress with her obesity treatment plan along with follow-up of her obesity related diagnoses. Sheila Carlson is on the Category 3 Plan and states she is following her eating plan approximately 50% of the time. Sheila Carlson states she is walking 30 minutes 3 times per week.  Today's visit was #: 2 Starting weight: 311 lbs Starting date: 05/12/2020 Today's weight: 311 lbs Today's date: 05/26/2020 Total lbs lost to date: 0 Total lbs lost since last in-office visit: 0  Interim History: Sheila Carlson did not feel well over the last few weeks. She had a cough and congestion.  Subjective:   Vitamin D deficiency. Sheila Carlson is not taking Vitamin D supplementation. Last Vitamin D was 30.3 on 05/12/2020.  Type 2 diabetes mellitus without complication, without long-term current use of insulin ([place]). Sheila Carlson does not normally check her blood sugars.   Lab Results  Component Value Date   HGBA1C 6.6 (H) 04/29/2020   Lab Results  Component Value Date   MICROALBUR 1.3 04/13/2020   LDLCALC 155 (H) 04/13/2020   CREATININE 0.75 04/26/2020   Lab Results  Component Value Date   INSULIN 11.9 05/12/2020   Other depression, with emotional eating. Sheila Carlson is struggling with emotional eating and using food for comfort to the extent that it is negatively impacting her health. She has been working on behavior modification techniques to help reduce her emotional eating and has been somewhat successful. She shows no sign of suicidal or homicidal ideations. Sheila Carlson denies absolute and relative contraindications.  At risk for osteoporosis. Sheila Carlson is at higher risk of osteopenia and osteoporosis due to Vitamin D deficiency.   Assessment/Plan:   Vitamin D deficiency. Low Vitamin D level contributes to fatigue and are associated with obesity, breast, and colon cancer. She was given a prescription for Vitamin D, Ergocalciferol, (DRISDOL) 1.25 MG (50000 UNIT) CAPS  capsule every week #4 with 0 refills and will follow-up for routine testing of Vitamin D, at least 2-3 times per year to avoid over-replacement.   Type 2 diabetes mellitus without complication, without long-term current use of insulin ([place]). Good blood sugar control is important to decrease the likelihood of diabetic complications such as nephropathy, neuropathy, limb loss, blindness, coronary artery disease, and death. Intensive lifestyle modification including diet, exercise and weight loss are the first line of treatment for diabetes. Handout was given on Hypoglycemia.  Other depression, with emotional eating. Behavior modification techniques were discussed today to help Sheila Carlson deal with her emotional/non-hunger eating behaviors.  Orders and follow up as documented in patient record. Prescription was given for buPROPion (WELLBUTRIN SR) 150 MG 12 hr tablet 1 PO daily #30 with 0 refills.  At risk for osteoporosis. Sheila Carlson was given approximately 15 minutes of osteoporosis prevention counseling today. Sheila Carlson is at risk for osteopenia and osteoporosis due to her Vitamin D deficiency. She was encouraged to take her Vitamin D and follow her higher calcium diet and increase strengthening exercise to help strengthen her bones and decrease her risk of osteopenia and osteoporosis.  Repetitive spaced learning was employed today to elicit superior memory formation and behavioral change.  Class 3 severe obesity with serious comorbidity and body mass index (BMI) of 45.0 to 49.9 in adult, unspecified obesity type ([place]).  Sheila Carlson is currently in the action stage of change. As such, her goal is to continue with weight loss efforts. She has agreed to the Category 3 Plan.   She will work  on meal planning, intentional eating, and increasing her water intake to 64 oz daily.  We reviewed with the patient labs from 05/12/2020 including Vitamin D, CBC, and insulin.  Exercise goals: All adults should avoid inactivity. Some  physical activity is better than none, and adults who participate in any amount of physical activity gain some health benefits.  Behavioral modification strategies: increasing lean protein intake, decreasing simple carbohydrates, increasing vegetables, increasing water intake, decreasing eating out, no skipping meals, meal planning and cooking strategies, keeping healthy foods in the home and planning for success.  Sheila Carlson has agreed to follow-up with our clinic in 2-3 weeks. She was informed of the importance of frequent follow-up visits to maximize her success with intensive lifestyle modifications for her multiple health conditions.   Objective:   Blood pressure 138/82, pulse 63, temperature 97.8 F (36.6 C), height 5\' 7"  (1.702 m), weight (!) 311 lb (141.1 kg). Body mass index is 48.71 kg/m.  General: Cooperative, alert, well developed, in no acute distress. HEENT: Conjunctivae and lids unremarkable. Cardiovascular: Regular rhythm.  Lungs: Normal work of breathing. Neurologic: No focal deficits.   Lab Results  Component Value Date   CREATININE 0.75 04/26/2020   BUN 11 04/26/2020   NA 135 04/26/2020   K 3.6 04/26/2020   CL 105 04/26/2020   CO2 30 04/26/2020   Lab Results  Component Value Date   ALT 12 04/13/2020   AST 16 04/13/2020   ALKPHOS 59 04/13/2020   BILITOT 0.4 04/13/2020   Lab Results  Component Value Date   HGBA1C 6.6 (H) 04/29/2020   Lab Results  Component Value Date   INSULIN 11.9 05/12/2020   Lab Results  Component Value Date   TSH 2.94 04/13/2020   Lab Results  Component Value Date   CHOL 229 (H) 04/13/2020   HDL 43.10 04/13/2020   LDLCALC 155 (H) 04/13/2020   TRIG 152.0 (H) 04/13/2020   CHOLHDL 5 04/13/2020   Lab Results  Component Value Date   WBC 5.2 05/12/2020   HGB 13.1 05/12/2020   HCT 40.4 05/12/2020   MCV 87 05/12/2020   PLT 373 05/12/2020   No results found for: IRON, TIBC, FERRITIN  Attestation Statements:   Reviewed by  clinician on day of visit: allergies, medications, problem list, medical history, surgical history, family history, social history, and previous encounter notes.  [name], am acting as Location manager for [employer], DO   I have reviewed the above documentation for accuracy and completeness, and I agree with the above. [name], DO

## 2020-05-27 ENCOUNTER — Encounter (INDEPENDENT_AMBULATORY_CARE_PROVIDER_SITE_OTHER): Payer: Self-pay | Admitting: Bariatrics

## 2020-06-16 ENCOUNTER — Ambulatory Visit (INDEPENDENT_AMBULATORY_CARE_PROVIDER_SITE_OTHER): Payer: 59 | Admitting: Bariatrics

## 2020-06-18 ENCOUNTER — Other Ambulatory Visit (INDEPENDENT_AMBULATORY_CARE_PROVIDER_SITE_OTHER): Payer: Self-pay | Admitting: Bariatrics

## 2020-06-18 DIAGNOSIS — E559 Vitamin D deficiency, unspecified: Secondary | ICD-10-CM

## 2020-06-22 ENCOUNTER — Other Ambulatory Visit (INDEPENDENT_AMBULATORY_CARE_PROVIDER_SITE_OTHER): Payer: Self-pay | Admitting: Bariatrics

## 2020-06-22 DIAGNOSIS — F3289 Other specified depressive episodes: Secondary | ICD-10-CM

## 2020-06-24 ENCOUNTER — Encounter: Payer: Self-pay | Admitting: Family

## 2020-07-01 ENCOUNTER — Encounter (INDEPENDENT_AMBULATORY_CARE_PROVIDER_SITE_OTHER): Payer: Self-pay | Admitting: Bariatrics

## 2020-07-01 ENCOUNTER — Other Ambulatory Visit: Payer: Self-pay

## 2020-07-01 ENCOUNTER — Ambulatory Visit (INDEPENDENT_AMBULATORY_CARE_PROVIDER_SITE_OTHER): Payer: 59 | Admitting: Bariatrics

## 2020-07-01 VITALS — BP 139/85 | HR 61 | Temp 97.7°F | Ht 67.0 in | Wt 309.0 lb

## 2020-07-01 DIAGNOSIS — E1159 Type 2 diabetes mellitus with other circulatory complications: Secondary | ICD-10-CM | POA: Diagnosis not present

## 2020-07-01 DIAGNOSIS — E559 Vitamin D deficiency, unspecified: Secondary | ICD-10-CM

## 2020-07-01 DIAGNOSIS — Z6841 Body Mass Index (BMI) 40.0 and over, adult: Secondary | ICD-10-CM

## 2020-07-01 DIAGNOSIS — Z9189 Other specified personal risk factors, not elsewhere classified: Secondary | ICD-10-CM | POA: Diagnosis not present

## 2020-07-01 DIAGNOSIS — I152 Hypertension secondary to endocrine disorders: Secondary | ICD-10-CM

## 2020-07-01 DIAGNOSIS — F3289 Other specified depressive episodes: Secondary | ICD-10-CM | POA: Diagnosis not present

## 2020-07-01 DIAGNOSIS — I10 Essential (primary) hypertension: Secondary | ICD-10-CM

## 2020-07-01 MED ORDER — BUPROPION HCL ER (SR) 200 MG PO TB12: 200.0000 mg | ORAL_TABLET | Freq: Every day | ORAL | 0 refills | Status: DC

## 2020-07-01 MED ORDER — VITAMIN D (ERGOCALCIFEROL) 1.25 MG (50000 UNIT) PO CAPS: 50000.0000 [IU] | ORAL_CAPSULE | ORAL | 0 refills | Status: DC

## 2020-07-01 NOTE — Progress Notes (Signed)
Chief Complaint:   OBESITY Sheila Carlson is here to discuss her progress with her obesity treatment plan along with follow-up of her obesity related diagnoses. [name] is on the Category 3 Plan and states she is following her eating plan approximately 50% of the time. Sheila Carlson states she is walking 2 miles daily 5 times per week.  Today's visit was #: 3 Starting weight: 311 lbs Starting date: 05/12/2020 Today's weight: 309 lbs Today's date: 07/01/2020 Total lbs lost to date: 2 Total lbs lost since last in-office visit: 2  Interim History: Sheila Carlson is down 2 lbs. She is drinking more water, but needs to drink more.  Subjective:   Vitamin D deficiency. No nausea, vomiting, or muscle weakness.    Ref. Range 05/12/2020 15:14  Vitamin D, 25-Hydroxy Latest Ref Range: 30.0 - 100.0 ng/mL 30.3   Essential hypertension. Cayden is taking HCTZ, Norvasc, and Toprol. Blood pressure is reasonably well controlled.  BP Readings from Last 3 Encounters:  07/01/20 139/85  05/26/20 138/82  05/12/20 131/82   Lab Results  Component Value Date   CREATININE 0.75 04/26/2020   CREATININE 0.69 04/13/2020   CREATININE 0.79 05/31/2019   Other depression, with emotional eating. Sheila Carlson is struggling with emotional eating and using food for comfort to the extent that it is negatively impacting her health. She has been working on behavior modification techniques to help reduce her emotional eating and has been somewhat successful. She shows no sign of suicidal or homicidal ideations. Sheila Carlson reports mild stress eating.  At risk for dehydration. Sheila Carlson is at increased risk for dehydration due to increased exercise and warmer weather.  Assessment/Plan:   Vitamin D deficiency. Low Vitamin D level contributes to fatigue and are associated with obesity, breast, and colon cancer. She was given a prescription for Vitamin D, Ergocalciferol, (DRISDOL) 1.25 MG (50000 UNIT) CAPS capsule every week #4 with 0 refills and  will follow-up for routine testing of Vitamin D, at least 2-3 times per year to avoid over-replacement.   Essential hypertension. Sheila Carlson is working on healthy weight loss and exercise to improve blood pressure control. We will watch for signs of hypotension as she continues her lifestyle modifications. She will continue her medications as directed.  Other depression, with emotional eating. Behavior modification techniques were discussed today to help Sheila Carlson deal with her emotional/non-hunger eating behaviors.  Orders and follow up as documented in patient record. Prescription was given for Wellbutrin SR 200 mg 1 PO daily #30 with 0 refills.  At risk for dehydration. Sheila Carlson was given approximately 15 minutes dehydration prevention counseling today. Sheila Carlson is at risk for dehydration due to weight loss and current medication(s). She was encouraged to hydrate and monitor fluid status to avoid dehydration as well as weight loss plateaus.   Class 3 severe obesity with serious comorbidity and body mass index (BMI) of 45.0 to 49.9 in adult, unspecified obesity type ([place]).  Sheila Carlson is currently in the action stage of change. As such, her goal is to continue with weight loss efforts. She has agreed to the Category 3 Plan.   She will work on meal planning and intentional eating.   Handout was provided on Shakes and Protein options.  Exercise goals: Sheila Carlson will continue to walk for exercise.  Behavioral modification strategies: increasing lean protein intake, decreasing simple carbohydrates, increasing vegetables, increasing water intake, decreasing eating out, no skipping meals, meal planning and cooking strategies, keeping healthy foods in the home and planning for success.  Sheila Carlson  has agreed to follow-up with our clinic in 2 weeks. She was informed of the importance of frequent follow-up visits to maximize her success with intensive lifestyle modifications for her multiple health conditions.   Objective:    Blood pressure 139/85, pulse 61, temperature 97.7 F (36.5 C), height 5\' 7"  (1.702 m), weight (!) 309 lb (140.2 kg), SpO2 99 %. Body mass index is 48.4 kg/m.  General: Cooperative, alert, well developed, in no acute distress. HEENT: Conjunctivae and lids unremarkable. Cardiovascular: Regular rhythm.  Lungs: Normal work of breathing. Neurologic: No focal deficits.   Lab Results  Component Value Date   CREATININE 0.75 04/26/2020   BUN 11 04/26/2020   NA 135 04/26/2020   K 3.6 04/26/2020   CL 105 04/26/2020   CO2 30 04/26/2020   Lab Results  Component Value Date   ALT 12 04/13/2020   AST 16 04/13/2020   ALKPHOS 59 04/13/2020   BILITOT 0.4 04/13/2020   Lab Results  Component Value Date   HGBA1C 6.6 (H) 04/29/2020   Lab Results  Component Value Date   INSULIN 11.9 05/12/2020   Lab Results  Component Value Date   TSH 2.94 04/13/2020   Lab Results  Component Value Date   CHOL 229 (H) 04/13/2020   HDL 43.10 04/13/2020   LDLCALC 155 (H) 04/13/2020   TRIG 152.0 (H) 04/13/2020   CHOLHDL 5 04/13/2020   Lab Results  Component Value Date   WBC 5.2 05/12/2020   HGB 13.1 05/12/2020   HCT 40.4 05/12/2020   MCV 87 05/12/2020   PLT 373 05/12/2020   No results found for: IRON, TIBC, FERRITIN  Attestation Statements:   Reviewed by clinician on day of visit: allergies, medications, problem list, medical history, surgical history, family history, social history, and previous encounter notes.  [name], am acting as Location manager for [employer], DO   I have reviewed the above documentation for accuracy and completeness, and I agree with the above. [name], DO

## 2020-07-13 ENCOUNTER — Ambulatory Visit (INDEPENDENT_AMBULATORY_CARE_PROVIDER_SITE_OTHER): Payer: 59

## 2020-07-13 ENCOUNTER — Other Ambulatory Visit (INDEPENDENT_AMBULATORY_CARE_PROVIDER_SITE_OTHER): Payer: 59

## 2020-07-13 ENCOUNTER — Other Ambulatory Visit: Payer: Self-pay

## 2020-07-13 DIAGNOSIS — E785 Hyperlipidemia, unspecified: Secondary | ICD-10-CM

## 2020-07-13 DIAGNOSIS — Z23 Encounter for immunization: Secondary | ICD-10-CM | POA: Diagnosis not present

## 2020-07-13 LAB — LIPID PANEL
Cholesterol: 122 mg/dL (ref 0–200)
HDL: 33.6 mg/dL — ABNORMAL LOW (ref >39.00)
LDL Cholesterol: 62 mg/dL (ref 0–99)
NonHDL: 88.06
Total CHOL/HDL Ratio: 4
Triglycerides: 130 mg/dL (ref 0.0–149.0)
VLDL: 26 mg/dL (ref 0.0–40.0)

## 2020-07-13 NOTE — Progress Notes (Signed)
Patient here today for Second shingrix and Pneumovax 23. 0.42mL shingrix given in left deltoid IM. 0.57mL pneumovax 23 given in right deltoid IM. Patient tolerated well. VIS given. Vaccine verification sheet signed. Updated [facility].

## 2020-07-22 ENCOUNTER — Ambulatory Visit (INDEPENDENT_AMBULATORY_CARE_PROVIDER_SITE_OTHER): Payer: 59 | Admitting: Bariatrics

## 2020-07-26 ENCOUNTER — Other Ambulatory Visit (INDEPENDENT_AMBULATORY_CARE_PROVIDER_SITE_OTHER): Payer: Self-pay | Admitting: Bariatrics

## 2020-07-26 DIAGNOSIS — F3289 Other specified depressive episodes: Secondary | ICD-10-CM

## 2020-07-26 DIAGNOSIS — E559 Vitamin D deficiency, unspecified: Secondary | ICD-10-CM

## 2020-08-11 ENCOUNTER — Other Ambulatory Visit: Payer: Self-pay

## 2020-08-11 ENCOUNTER — Ambulatory Visit (INDEPENDENT_AMBULATORY_CARE_PROVIDER_SITE_OTHER): Payer: 59 | Admitting: Family

## 2020-08-11 ENCOUNTER — Encounter: Payer: Self-pay | Admitting: Family

## 2020-08-11 VITALS — BP 150/76 | HR 59 | Temp 98.0°F | Resp 16 | Ht 67.0 in | Wt 313.0 lb

## 2020-08-11 DIAGNOSIS — E669 Obesity, unspecified: Secondary | ICD-10-CM | POA: Diagnosis not present

## 2020-08-11 DIAGNOSIS — E1159 Type 2 diabetes mellitus with other circulatory complications: Secondary | ICD-10-CM | POA: Diagnosis not present

## 2020-08-11 DIAGNOSIS — I10 Essential (primary) hypertension: Secondary | ICD-10-CM | POA: Diagnosis not present

## 2020-08-11 DIAGNOSIS — E1169 Type 2 diabetes mellitus with other specified complication: Secondary | ICD-10-CM

## 2020-08-11 DIAGNOSIS — E785 Hyperlipidemia, unspecified: Secondary | ICD-10-CM

## 2020-08-11 LAB — HEMOGLOBIN A1C: Hgb A1c MFr Bld: 6.6 % — ABNORMAL HIGH (ref 4.6–6.5)

## 2020-08-11 LAB — BASIC METABOLIC PANEL
BUN: 18 mg/dL (ref 6–23)
CO2: 28 mEq/L (ref 19–32)
Calcium: 9.1 mg/dL (ref 8.4–10.5)
Chloride: 101 mEq/L (ref 96–112)
Creatinine, Ser: 0.77 mg/dL (ref 0.40–1.20)
GFR: 92.3 mL/min (ref >60.00)
Glucose, Bld: 99 mg/dL (ref 70–99)
Potassium: 4 mEq/L (ref 3.5–5.1)
Sodium: 139 mEq/L (ref 135–145)

## 2020-08-11 NOTE — Progress Notes (Signed)
Subjective:    Patient ID: Sheila Carlson, female    DOB: 06-30-1961, 59 y.o.   MRN: [id number]  HPI  HTN- maintained on amlodipine, lisinopril and toprol and hctz.   BP Readings from Last 3 Encounters:  08/11/20 (!) 150/76  07/01/20 139/85  05/26/20 138/82   Lab Results  Component Value Date   HGBA1C 6.6 (H) 04/29/2020   Lab Results  Component Value Date   MICROALBUR 1.3 04/13/2020   LDLCALC 62 07/13/2020   CREATININE 0.75 04/26/2020     Review of Systems See HPI  Past Medical History:  Diagnosis Date  . COVID-19 04/2020  . Diabetes type 2, controlled ([place]) 04/29/2020  . History of low potassium   . Hyperlipidemia 04/16/2020  . Hypertension      Social History   Socioeconomic History  . Marital status: Married    Spouse name: Awanda Mink  . Number of children: 2  . Years of education: Not on file  . Highest education level: Not on file  Occupational History  . 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 Smoking status: Former Smoker    Packs/day: 0.50    Years: 2.00    Pack years: 1.00    Types: Cigarettes  . Smokeless tobacco: Never Used  Vaping Use  . Vaping Use: Never used  Substance and Sexual Activity  . Alcohol use: Yes    Comment: occ  . Drug use: No  . Sexual activity: Yes    Partners: Male  Other Topics Concern  . Not on file  Social History Narrative   Patient has 2 biological sons   2 adopted children (son, daughter)   Hotel manager   Married (second married)   Completed bachelors in early childhood   No pets   Enjoys Corporate investment banker booking, decorating, shopping   Social Determinants of Health   Financial Resource Strain:   . Difficulty of Paying Living Expenses:   Food Insecurity:   . Worried About Charity fundraiser in the Last Year:   . Arboriculturist in the Last Year:   Transportation Needs:   . Film/video editor (Medical):   [name] Lack of Transportation (Non-Medical):   Physical Activity: Unknown  . Days of Exercise per Week:  0 days  . Minutes of Exercise per Session: Not on file  Stress:   . Feeling of Stress :   Social Connections:   . Frequency of Communication with Friends and Family:   . Frequency of Social Gatherings with Friends and Family:   . Attends Religious Services:   . Active Member of Clubs or Organizations:   . Attends Archivist Meetings:   [name] Marital Status:   Intimate Partner Violence:   . Fear of Current or Ex-Partner:   . Emotionally Abused:   [name] Physically Abused:   . Sexually Abused:     Past Surgical History:  Procedure Laterality Date  . CESAREAN SECTION      Family History  Problem Relation Age of Onset  . Hypertension Mother   . Diabetes Mother   . High Cholesterol Mother   . Alcoholism Mother   . Obesity Mother   . Alcohol abuse Father   . Hypertension Father   . Asthma Sister   . Colitis Sister        pt unsure    Allergies  Allergen Reactions  . Hydrocodone     Causes hallucinations.  Patient states she can tolerate oxycodone.  [name]  Penicillins Swelling    Current Outpatient Medications on File Prior to Visit  Medication Sig Dispense Refill  . amLODipine (NORVASC) 10 MG tablet Take 10 mg by mouth daily.    [name] atorvastatin (LIPITOR) 20 MG tablet Take 1 tablet (20 mg total) by mouth daily. 90 tablet 1  . buPROPion (WELLBUTRIN SR) 200 MG 12 hr tablet Take 1 tablet (200 mg total) by mouth daily. 30 tablet 0  . hydrochlorothiazide (HYDRODIURIL) 25 MG tablet Take 25 mg by mouth daily.    [name] lisinopril (PRINIVIL,ZESTRIL) 40 MG tablet Take 40 mg by mouth daily.      . metoprolol succinate (TOPROL-XL) 50 MG 24 hr tablet Take 1 tablet (50 mg total) by mouth daily. Take with or immediately following a meal. 90 tablet 1  . potassium chloride SA (KLOR-CON) 20 MEQ tablet Take 1 tablet by mouth once daily. 30 tablet 3  . Vitamin D, Ergocalciferol, (DRISDOL) 1.25 MG (50000 UNIT) CAPS capsule Take 1 capsule (50,000 Units total) by mouth every 7 (seven) days. 4 capsule 0    No current facility-administered medications on file prior to visit.    BP (!) 150/76 (BP Location: Right Arm, Patient Position: Sitting, Cuff Size: Large)   Pulse (!) 59   Temp 98 F (36.7 C) (Oral)   Resp 16   Ht 5\' 7"  (1.702 m)   Wt (!) 313 lb (142 kg)   SpO2 100%   BMI 49.02 kg/m       Objective:   Physical Exam Constitutional:      Appearance: She is well-developed.  Neck:     Thyroid: No thyromegaly.  Cardiovascular:     Rate and Rhythm: Normal rate and regular rhythm.     Heart sounds: Normal heart sounds. No murmur heard.   Pulmonary:     Effort: Pulmonary effort is normal. No respiratory distress.     Breath sounds: Normal breath sounds. No wheezing.  Musculoskeletal:     Cervical back: Neck supple.  Skin:    General: Skin is warm and dry.     Comments: Blood blister noted on left posterior heel.    Neurological:     Mental Status: She is alert and oriented to person, place, and time.  Psychiatric:        Behavior: Behavior normal.        Thought Content: Thought content normal.        Judgment: Judgment normal.           Assessment & Plan:  HTN- bp elevated today- but pt has not taken AM meds yet. Advised pt to continue current meds and check bp daily for 4 days. Send me readings via mychart.   Hyperlipidemia- LDL at goal. Continue statin.  Lab Results  Component Value Date   CHOL 122 07/13/2020   HDL 33.60 (L) 07/13/2020   LDLCALC 62 07/13/2020   TRIG 130.0 07/13/2020   CHOLHDL 4 07/13/2020   DM2- obtain A1C.  Clinically stable.  This visit occurred during the SARS-CoV-2 public health emergency.  Safety protocols were in place, including screening questions prior to the visit, additional usage of staff PPE, and extensive cleaning of exam room while observing appropriate contact time as indicated for disinfecting solutions.

## 2020-08-11 NOTE — Patient Instructions (Signed)
Please complete lab work prior to leaving. Check blood pressure once daily for 3 days and then send me your readings via mychart.

## 2020-09-05 ENCOUNTER — Other Ambulatory Visit: Payer: Self-pay | Admitting: Family

## 2020-11-16 ENCOUNTER — Encounter: Payer: 59 | Admitting: Family

## 2020-12-06 ENCOUNTER — Other Ambulatory Visit: Payer: Self-pay | Admitting: Family

## 2021-01-04 ENCOUNTER — Other Ambulatory Visit: Payer: Self-pay | Admitting: Family

## 2021-01-16 ENCOUNTER — Other Ambulatory Visit: Payer: Self-pay | Admitting: Family

## 2021-02-21 ENCOUNTER — Ambulatory Visit: Payer: 59 | Admitting: Family Medicine

## 2021-02-21 ENCOUNTER — Ambulatory Visit: Payer: 59 | Admitting: Family

## 2021-02-21 DIAGNOSIS — Z0289 Encounter for other administrative examinations: Secondary | ICD-10-CM

## 2021-02-22 ENCOUNTER — Ambulatory Visit (INDEPENDENT_AMBULATORY_CARE_PROVIDER_SITE_OTHER): Payer: 59 | Admitting: Family Medicine

## 2021-02-22 ENCOUNTER — Other Ambulatory Visit: Payer: Self-pay

## 2021-02-22 ENCOUNTER — Encounter: Payer: Self-pay | Admitting: Family Medicine

## 2021-02-22 VITALS — BP 128/78 | HR 69 | Temp 98.4°F | Ht 66.0 in | Wt 314.5 lb

## 2021-02-22 DIAGNOSIS — M7989 Other specified soft tissue disorders: Secondary | ICD-10-CM

## 2021-02-22 MED ORDER — POTASSIUM CHLORIDE CRYS ER 20 MEQ PO TBCR: EXTENDED_RELEASE_TABLET | ORAL | 0 refills | Status: DC

## 2021-02-22 MED ORDER — ATORVASTATIN CALCIUM 20 MG PO TABS: ORAL_TABLET | ORAL | 0 refills | Status: DC

## 2021-02-22 MED ORDER — AMLODIPINE BESYLATE 10 MG PO TABS: 10.0000 mg | ORAL_TABLET | Freq: Every day | ORAL | 0 refills | Status: DC

## 2021-02-22 MED ORDER — LISINOPRIL 40 MG PO TABS: 40.0000 mg | ORAL_TABLET | Freq: Every day | ORAL | 0 refills | Status: DC

## 2021-02-22 NOTE — Progress Notes (Signed)
Chief Complaint  Patient presents with  . Foot Swelling    Feet swelling at night     Sheila Carlson here for bilateral leg swelling.  Duration: 2 weeks Hx of prolonged bedrest, recent surgery, travel or injury? Yes Pain the calf? No SOB? No Personal or family history of clot or bleeding disorder? No Hx of heart failure, renal failure, hepatic failure? No Diet is "not good".  She elevates her legs at night that does help.   Past Medical History:  Diagnosis Date  . COVID-19 04/2020  . Diabetes type 2, controlled ([place]) 04/29/2020  . History of low potassium   . Hyperlipidemia 04/16/2020  . Hypertension    Family History  Problem Relation Age of Onset  . Hypertension Mother   . Diabetes Mother   . High Cholesterol Mother   . Alcoholism Mother   . Obesity Mother   . Alcohol abuse Father   . Hypertension Father   . Asthma Sister   . Colitis Sister        pt unsure   Past Surgical History:  Procedure Laterality Date  . CESAREAN SECTION      Current Outpatient Medications:  .  amLODipine (NORVASC) 10 MG tablet, Take 10 mg by mouth daily., Disp: , Rfl:  .  atorvastatin (LIPITOR) 20 MG tablet, TAKE 1 TABLET(20 MG) BY MOUTH DAILY, Disp: 30 tablet, Rfl: 0 .  buPROPion (WELLBUTRIN SR) 200 MG 12 hr tablet, Take 1 tablet (200 mg total) by mouth daily., Disp: 30 tablet, Rfl: 0 .  hydrochlorothiazide (HYDRODIURIL) 25 MG tablet, Take 25 mg by mouth daily., Disp: , Rfl:  .  lisinopril (PRINIVIL,ZESTRIL) 40 MG tablet, Take 40 mg by mouth daily., Disp: , Rfl:  .  metoprolol succinate (TOPROL-XL) 50 MG 24 hr tablet, TAKE 1 TABLET(50 MG) BY MOUTH DAILY WITH OR IMMEDIATELY FOLLOWING A MEAL, Disp: 90 tablet, Rfl: 1 .  potassium chloride SA (KLOR-CON) 20 MEQ tablet, TAKE 1 TABLET BY MOUTH EVERY DAY, Disp: 30 tablet, Rfl: 3 .  Vitamin D, Ergocalciferol, (DRISDOL) 1.25 MG (50000 UNIT) CAPS capsule, Take 1 capsule (50,000 Units total) by mouth every 7 (seven) days., Disp: 4 capsule, Rfl: 0  BP  128/78 (BP Location: Right Arm, Patient Position: Sitting, Cuff Size: Large)   Pulse 69   Temp 98.4 F (36.9 C) (Oral)   Ht 5\' 6"  (1.676 m)   Wt (!) 314 lb 8 oz (142.7 kg)   SpO2 99%   BMI 50.76 kg/m  Gen- awake, alert, appears stated age Heart- RRR, no murmurs, 2+ pitting LE edema b/l Lungs- CTAB, normal effort w/o accessory muscle use MSK- No calf pain  Psych: Age appropriate judgment and insight  Localized swelling of both lower extremities - Plan: Comprehensive metabolic panel, TSH  Compression stockings, elevate legs, physical activity, mind salt intake. Likely dependent edema. Ck labs.  F/u prn. Pt voiced understanding and agreement to the plan.  [place], DO 02/22/21  3:21 PM

## 2021-02-22 NOTE — Patient Instructions (Signed)
Give us 2-3 business days to get the results of your labs back.   For the swelling in your lower extremities, be sure to elevate your legs when able, mind the salt intake, stay physically active and consider wearing compression stockings.   Let us know if you need anything. 

## 2021-02-23 LAB — COMPREHENSIVE METABOLIC PANEL
ALT: 11 U/L (ref 0–35)
AST: 12 U/L (ref 0–37)
Albumin: 3.5 g/dL (ref 3.5–5.2)
Alkaline Phosphatase: 66 U/L (ref 39–117)
BUN: 12 mg/dL (ref 6–23)
CO2: 31 mEq/L (ref 19–32)
Calcium: 9.1 mg/dL (ref 8.4–10.5)
Chloride: 102 mEq/L (ref 96–112)
Creatinine, Ser: 0.72 mg/dL (ref 0.40–1.20)
GFR: 90.34 mL/min (ref >60.00)
Glucose, Bld: 105 mg/dL — ABNORMAL HIGH (ref 70–99)
Potassium: 3.7 mEq/L (ref 3.5–5.1)
Sodium: 140 mEq/L (ref 135–145)
Total Bilirubin: 0.3 mg/dL (ref 0.2–1.2)
Total Protein: 6.7 g/dL (ref 6.0–8.3)

## 2021-02-24 LAB — TSH: TSH: 2.53 u[IU]/mL (ref 0.35–4.50)

## 2021-03-11 ENCOUNTER — Other Ambulatory Visit: Payer: Self-pay | Admitting: Family

## 2021-03-11 ENCOUNTER — Other Ambulatory Visit: Payer: Self-pay

## 2021-03-11 ENCOUNTER — Ambulatory Visit (INDEPENDENT_AMBULATORY_CARE_PROVIDER_SITE_OTHER): Payer: 59 | Admitting: Internal Medicine

## 2021-03-11 VITALS — BP 144/76 | HR 63 | Temp 98.1°F | Ht 66.0 in | Wt 319.0 lb

## 2021-03-11 DIAGNOSIS — J302 Other seasonal allergic rhinitis: Secondary | ICD-10-CM | POA: Diagnosis not present

## 2021-03-11 MED ORDER — PREDNISONE 10 MG PO TABS: ORAL_TABLET | ORAL | 0 refills | Status: DC

## 2021-03-11 MED ORDER — TRIAMCINOLONE ACETONIDE 0.1 % EX LOTN: 1.0000 "application " | TOPICAL_LOTION | Freq: Three times a day (TID) | CUTANEOUS | 0 refills | Status: DC

## 2021-03-11 NOTE — Patient Instructions (Signed)
Start taking Claritin 10 mg OTC: 1 tablet daily, take it for at least 10 days and then as needed for allergies  Take prednisone as prescribed  Use the lotion to 3 times a day as needed on the areas were itching the most  Call in few days if not better  Call immediately if you develop cough, wheezing or difficulty breathing

## 2021-03-11 NOTE — Progress Notes (Signed)
Subjective:    Patient ID: Sheila Carlson, female    DOB: October 19, 1961, 60 y.o.   MRN: [id number]  DOS:  03/11/2021 Type of visit - description: Acute The chief complaint is itching all over. This started few days ago. Multiple question asked about new exposures but all answers were negative  She has a history of allergies, typically sneezing, coughing but no generalized itching.  No fever chills No lip or tongue swelling No cough or wheezing. Occasionally has hives but not lately.    Review of Systems See above   Past Medical History:  Diagnosis Date  . COVID-19 04/2020  . Diabetes type 2, controlled ([place]) 04/29/2020  . History of low potassium   . Hyperlipidemia 04/16/2020  . Hypertension     Past Surgical History:  Procedure Laterality Date  . CESAREAN SECTION      Allergies as of 03/11/2021      Reactions   Hydrocodone    Causes hallucinations.  Patient states she can tolerate oxycodone.   Penicillins Swelling      Medication List       Accurate as of March 11, 2021 11:59 PM. If you have any questions, ask your nurse or doctor.        amLODipine 10 MG tablet Commonly known as: NORVASC Take 1 tablet (10 mg total) by mouth daily.   atorvastatin 20 MG tablet Commonly known as: LIPITOR TAKE 1 TABLET(20 MG) BY MOUTH DAILY   buPROPion 200 MG 12 hr tablet Commonly known as: Wellbutrin SR Take 1 tablet (200 mg total) by mouth daily.   hydrochlorothiazide 25 MG tablet Commonly known as: HYDRODIURIL Take 25 mg by mouth daily.   lisinopril 40 MG tablet Commonly known as: ZESTRIL Take 1 tablet (40 mg total) by mouth daily.   metoprolol succinate 50 MG 24 hr tablet Commonly known as: TOPROL-XL TAKE 1 TABLET(50 MG) BY MOUTH DAILY WITH OR IMMEDIATELY FOLLOWING A MEAL   potassium chloride SA 20 MEQ tablet Commonly known as: KLOR-CON TAKE 1 TABLET BY MOUTH EVERY DAY   predniSONE 10 MG tablet Commonly known as: DELTASONE 3 tabs x 3 days, 2 tabs x 3 days, 1  tab x 3 days Started by: Kathlene November, MD   triamcinolone lotion 0.1 % Commonly known as: KENALOG Apply 1 application topically 3 (three) times daily. Started by: Kathlene November, MD   Vitamin D (Ergocalciferol) 1.25 MG (50000 UNIT) Caps capsule Commonly known as: DRISDOL Take 1 capsule (50,000 Units total) by mouth every 7 (seven) days.          Objective:   Physical Exam BP (!) 144/76 (BP Location: Left Arm, Patient Position: Sitting, Cuff Size: Large)   Pulse 63   Temp 98.1 F (36.7 C) (Oral)   Ht 5\' 6"  (1.676 m)   Wt (!) 319 lb (144.7 kg)   SpO2 98%   BMI 51.49 kg/m  General:   Well developed, NAD, BMI noted. HEENT:  Normocephalic . Face symmetric, atraumatic Lungs:  Few scattered wheezes  with forced cough  Normal respiratory effort, no intercostal retractions, no accessory muscle use. Heart: RRR,  no murmur.  Lower extremities: no pretibial edema bilaterally  Skin:  (Escorted by Marcie Bal CMA) No actual rash under the breasts, at the underarms or between the breasts.  The areas are not red, warm.  Perhaps a slightly hyper pigmented Neurologic:  alert & oriented X3.  Speech normal, gait appropriate for age and unassisted Psych--  Cognition and judgment appear intact.  Cooperative with normal attention span and concentration.  Behavior appropriate. No anxious or depressed appearing.      Assessment     60 year old female, PMH includes HTN, DM, high cholesterol, eczema, presents with  Allergies: Characterized by general itching but no rash.  No systemic symptoms, no new exposures although  pollen season has started She denies cough, wheezing and difficulty breathing, no history of asthma but  on exam I did hear some wheezing. Plan: Claritin, round of prednisone, Kenalog lotion as needed. Call if not better.  See AVS.  This visit occurred during the SARS-CoV-2 public health emergency.  Safety protocols were in place, including screening questions prior to the visit,  additional usage of staff PPE, and extensive cleaning of exam room while observing appropriate contact time as indicated for disinfecting solutions.

## 2021-03-21 ENCOUNTER — Encounter: Payer: Self-pay | Admitting: Family

## 2021-03-21 ENCOUNTER — Other Ambulatory Visit: Payer: Self-pay

## 2021-03-21 ENCOUNTER — Ambulatory Visit (INDEPENDENT_AMBULATORY_CARE_PROVIDER_SITE_OTHER): Payer: 59 | Admitting: Family

## 2021-03-21 VITALS — BP 125/81 | HR 95 | Temp 98.4°F | Resp 16 | Ht 66.0 in | Wt 316.0 lb

## 2021-03-21 DIAGNOSIS — N950 Postmenopausal bleeding: Secondary | ICD-10-CM | POA: Diagnosis not present

## 2021-03-21 DIAGNOSIS — E559 Vitamin D deficiency, unspecified: Secondary | ICD-10-CM | POA: Diagnosis not present

## 2021-03-21 DIAGNOSIS — E669 Obesity, unspecified: Secondary | ICD-10-CM

## 2021-03-21 DIAGNOSIS — Z1159 Encounter for screening for other viral diseases: Secondary | ICD-10-CM

## 2021-03-21 DIAGNOSIS — Z1211 Encounter for screening for malignant neoplasm of colon: Secondary | ICD-10-CM

## 2021-03-21 DIAGNOSIS — E1159 Type 2 diabetes mellitus with other circulatory complications: Secondary | ICD-10-CM

## 2021-03-21 DIAGNOSIS — Z0000 Encounter for general adult medical examination without abnormal findings: Secondary | ICD-10-CM

## 2021-03-21 DIAGNOSIS — H6121 Impacted cerumen, right ear: Secondary | ICD-10-CM

## 2021-03-21 DIAGNOSIS — I152 Hypertension secondary to endocrine disorders: Secondary | ICD-10-CM

## 2021-03-21 DIAGNOSIS — E1169 Type 2 diabetes mellitus with other specified complication: Secondary | ICD-10-CM | POA: Diagnosis not present

## 2021-03-21 DIAGNOSIS — Z114 Encounter for screening for human immunodeficiency virus [HIV]: Secondary | ICD-10-CM

## 2021-03-21 DIAGNOSIS — E785 Hyperlipidemia, unspecified: Secondary | ICD-10-CM

## 2021-03-21 MED ORDER — METOPROLOL SUCCINATE ER 50 MG PO TB24: ORAL_TABLET | ORAL | 1 refills | Status: DC

## 2021-03-21 MED ORDER — HYDROCHLOROTHIAZIDE 25 MG PO TABS: 25.0000 mg | ORAL_TABLET | Freq: Every day | ORAL | 1 refills | Status: DC

## 2021-03-21 NOTE — Progress Notes (Signed)
Subjective:    Patient ID: Sheila Carlson, female    DOB: 06/20/1961, 60 y.o.   MRN: [id number]  HPI  Patient presents today for complete physical.  Immunizations: pfizer x 2, tdap 2021, shingrix x 2  Diet:  Needs improvement Wt Readings from Last 3 Encounters:  03/21/21 (!) 316 lb (143.3 kg)  03/11/21 (!) 319 lb (144.7 kg)  02/22/21 (!) 314 lb 8 oz (142.7 kg)  Exercise: no Colonoscopy: due Pap Smear: 05/11/20 Mammogram: 05/18/20 Vision:  due Dental:  Up to date  Hyperlipidemia- maintained on atorvastatin 20mg .  Lab Results  Component Value Date   CHOL 122 07/13/2020   HDL 33.60 (L) 07/13/2020   LDLCALC 62 07/13/2020   TRIG 130.0 07/13/2020   CHOLHDL 4 07/13/2020   HTN- maintained on hctz 25mg , lisinopril 40mg , toprol xl 50mg .   BP Readings from Last 3 Encounters:  03/21/21 125/81  03/11/21 (!) 144/76  02/22/21 128/78   Vit d deficiency- She is taking an otc dose (once ever 7 days.   LMP was 2-3 years ago.  She reports that she developed vaginal bleeding Thursday-Saturday. Review of Systems  Constitutional: Negative for unexpected weight change.  HENT: Negative for hearing loss and rhinorrhea.   Eyes: Negative for visual disturbance.  Respiratory: Negative for cough and shortness of breath.   Cardiovascular: Negative for chest pain.  Gastrointestinal: Negative for diarrhea, nausea and vomiting.  Genitourinary: Positive for vaginal bleeding. Negative for dysuria and frequency.  Musculoskeletal: Negative for arthralgias.  Skin: Negative for rash.  Neurological: Negative for headaches.  Hematological: Negative for adenopathy.  Psychiatric/Behavioral:       Denies depression/anxiety       Past Medical History:  Diagnosis Date  . COVID-19 04/2020  . Diabetes type 2, controlled ([place]) 04/29/2020  . History of low potassium   . Hyperlipidemia 04/16/2020  . Hypertension      Social History   Socioeconomic History  . Marital status: Married    Spouse name:  Awanda Mink  . Number of children: 2  . Years of education: Not on file  . Highest education level: Not on file  Occupational History  . 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 Smoking status: Former Smoker    Packs/day: 0.50    Years: 2.00    Pack years: 1.00    Types: Cigarettes  . Smokeless tobacco: Never Used  Vaping Use  . Vaping Use: Never used  Substance and Sexual Activity  . Alcohol use: Yes    Comment: occ  . Drug use: No  . Sexual activity: Yes    Partners: Male  Other Topics Concern  . Not on file  Social History Narrative   Patient has 2 biological sons   2 adopted children (son, daughter)   Hotel manager   Married (second married)   Completed bachelors in early childhood   No pets   Enjoys Corporate investment banker booking, decorating, shopping   Social Determinants of Health   Financial Resource Strain: Not on file  Food Insecurity: Not on file  Transportation Needs: Not on file  Physical Activity: Unknown  . Days of Exercise per Week: 0 days  . Minutes of Exercise per Session: Not on file  Stress: Not on file  Social Connections: Not on file  Intimate Partner Violence: Not on file    Past Surgical History:  Procedure Laterality Date  . CESAREAN SECTION      Family History  Problem Relation Age of Onset  .  Hypertension Mother   . Diabetes Mother   . High Cholesterol Mother   . Alcoholism Mother   . Obesity Mother   . Alcohol abuse Father   . Hypertension Father   . Asthma Sister   . Colitis Sister        pt unsure    Allergies  Allergen Reactions  . Hydrocodone     Causes hallucinations.  Patient states she can tolerate oxycodone.  [name] Penicillins Swelling    Current Outpatient Medications on File Prior to Visit  Medication Sig Dispense Refill  . amLODipine (NORVASC) 10 MG tablet Take 1 tablet (10 mg total) by mouth daily. 30 tablet 0  . atorvastatin (LIPITOR) 20 MG tablet TAKE 1 TABLET(20 MG) BY MOUTH DAILY 30 tablet 0  . hydrochlorothiazide  (HYDRODIURIL) 25 MG tablet Take 25 mg by mouth daily.    [name] lisinopril (ZESTRIL) 40 MG tablet Take 1 tablet (40 mg total) by mouth daily. 30 tablet 0  . metoprolol succinate (TOPROL-XL) 50 MG 24 hr tablet TAKE 1 TABLET(50 MG) BY MOUTH DAILY WITH OR IMMEDIATELY FOLLOWING A MEAL 90 tablet 1  . potassium chloride SA (KLOR-CON) 20 MEQ tablet TAKE 1 TABLET BY MOUTH EVERY DAY 30 tablet 0  . triamcinolone lotion (KENALOG) 0.1 % Apply 1 application topically 3 (three) times daily. 120 mL 0  . Vitamin D, Ergocalciferol, (DRISDOL) 1.25 MG (50000 UNIT) CAPS capsule Take 1 capsule (50,000 Units total) by mouth every 7 (seven) days. 4 capsule 0   No current facility-administered medications on file prior to visit.    BP 125/81 (BP Location: Right Arm, Patient Position: Sitting, Cuff Size: Large)   Pulse 95   Temp 98.4 F (36.9 C) (Oral)   Resp 16   Ht 5\' 6"  (1.676 m)   Wt (!) 316 lb (143.3 kg)   SpO2 100%   BMI 51.00 kg/m    Objective:   Physical Exam  Physical Exam  Constitutional: She is oriented to person, place, and time. She appears well-developed and well-nourished. No distress.  HENT:  Head: Normocephalic and atraumatic.  Right Ear: ceruminosis Left Ear: Tympanic membrane and ear canal normal.  Mouth/Throat: Not examined- pt wearing mask Eyes: Pupils are equal, round, and reactive to light. No scleral icterus.  Neck: Normal range of motion. No thyromegaly present.  Cardiovascular: Normal rate and regular rhythm.   No murmur heard. Pulmonary/Chest: Effort normal and breath sounds normal. No respiratory distress. He has no wheezes. She has no rales. She exhibits no tenderness.  Abdominal: Soft. Bowel sounds are normal. She exhibits no distension and no mass. There is no tenderness. There is no rebound and no guarding.  Musculoskeletal: She exhibits no edema.  Lymphadenopathy:    She has no cervical adenopathy.  Neurological: She is alert and oriented to person, place, and time. She  has normal patellar reflexes. She exhibits normal muscle tone. Coordination normal.  Skin: Skin is warm and dry.  Psychiatric: She has a normal mood and affect. Her behavior is normal. Judgment and thought content normal.  Breasts: Examined lying Right: Without masses, retractions, discharge or axillary adenopathy.  Left: Without masses, retractions, discharge or axillary adenopathy.  Pelvic: deferred           Assessment & Plan:   Preventative care- discussed healthy diet, exercise and weight loss. Refer for mammogram, colo, DM eye exam.  Check HIV/Hep C screening.  Recommended that she get a COVID-19 booster shot.    Post-menopausal bleeding- refer to GYN  for further evaluation.  DM2- clinically stable. OBtain A1C.  Hyperlipidemia- tolerating atorvastatin 20mg  once daily and obtain follow up lipid panel.  HTN- bp stable. Continue hctz 25mg , lisinopril 40mg , toprol xl 50mg .   Vit D deficiency- check vit D level.  Ceruminosis- Right ear was flushed by CMA, pt tolerated procedure without difficulty.  This visit occurred during the SARS-CoV-2 public health emergency.  Safety protocols were in place, including screening questions prior to the visit, additional usage of staff PPE, and extensive cleaning of exam room while observing appropriate contact time as indicated for disinfecting solutions.        Assessment & Plan:

## 2021-03-21 NOTE — Patient Instructions (Signed)
Please complete lab work prior to leaving. You should be contacted about scheduling your appointment with GYN, mammogram, vision, and colonoscopy. Let us know if you have not heard back about these appointments in 1 week.   Try to work on a healthier diet, more non-starchy vegetable and lean proteins. Less carbs/sugars.

## 2021-03-29 ENCOUNTER — Encounter: Payer: 59 | Admitting: Family

## 2021-03-30 ENCOUNTER — Other Ambulatory Visit: Payer: Self-pay | Admitting: Family

## 2021-04-11 ENCOUNTER — Other Ambulatory Visit: Payer: Self-pay

## 2021-04-12 ENCOUNTER — Other Ambulatory Visit: Payer: 59

## 2021-04-12 ENCOUNTER — Other Ambulatory Visit (INDEPENDENT_AMBULATORY_CARE_PROVIDER_SITE_OTHER): Payer: 59

## 2021-04-12 DIAGNOSIS — Z1159 Encounter for screening for other viral diseases: Secondary | ICD-10-CM

## 2021-04-12 DIAGNOSIS — E785 Hyperlipidemia, unspecified: Secondary | ICD-10-CM

## 2021-04-12 DIAGNOSIS — Z114 Encounter for screening for human immunodeficiency virus [HIV]: Secondary | ICD-10-CM

## 2021-04-12 DIAGNOSIS — E559 Vitamin D deficiency, unspecified: Secondary | ICD-10-CM | POA: Diagnosis not present

## 2021-04-12 DIAGNOSIS — E669 Obesity, unspecified: Secondary | ICD-10-CM

## 2021-04-12 DIAGNOSIS — E1169 Type 2 diabetes mellitus with other specified complication: Secondary | ICD-10-CM

## 2021-04-12 LAB — COMPREHENSIVE METABOLIC PANEL
ALT: 15 U/L (ref 0–35)
AST: 13 U/L (ref 0–37)
Albumin: 3.5 g/dL (ref 3.5–5.2)
Alkaline Phosphatase: 64 U/L (ref 39–117)
BUN: 12 mg/dL (ref 6–23)
CO2: 31 mEq/L (ref 19–32)
Calcium: 9.2 mg/dL (ref 8.4–10.5)
Chloride: 102 mEq/L (ref 96–112)
Creatinine, Ser: 0.68 mg/dL (ref 0.40–1.20)
GFR: 94.01 mL/min (ref >60.00)
Glucose, Bld: 120 mg/dL — ABNORMAL HIGH (ref 70–99)
Potassium: 3.8 mEq/L (ref 3.5–5.1)
Sodium: 140 mEq/L (ref 135–145)
Total Bilirubin: 0.5 mg/dL (ref 0.2–1.2)
Total Protein: 6.8 g/dL (ref 6.0–8.3)

## 2021-04-12 LAB — LIPID PANEL
Cholesterol: 138 mg/dL (ref 0–200)
HDL: 37.1 mg/dL — ABNORMAL LOW (ref >39.00)
LDL Cholesterol: 68 mg/dL (ref 0–99)
NonHDL: 100.6
Total CHOL/HDL Ratio: 4
Triglycerides: 162 mg/dL — ABNORMAL HIGH (ref 0.0–149.0)
VLDL: 32.4 mg/dL (ref 0.0–40.0)

## 2021-04-12 LAB — VITAMIN D 25 HYDROXY (VIT D DEFICIENCY, FRACTURES): VITD: 38.84 ng/mL (ref 30.00–100.00)

## 2021-04-12 LAB — HEMOGLOBIN A1C: Hgb A1c MFr Bld: 6.8 % — ABNORMAL HIGH (ref 4.6–6.5)

## 2021-04-12 NOTE — Addendum Note (Signed)
Addended by: Kelle Darting A on: 04/12/2021 10:35 AM   Modules accepted: Orders

## 2021-04-13 LAB — HEPATITIS C ANTIBODY
Hepatitis C Ab: NONREACTIVE
SIGNAL TO CUT-OFF: 0.05 (ref <1.00)

## 2021-04-13 LAB — HIV ANTIBODY (ROUTINE TESTING W REFLEX): HIV 1&2 Ab, 4th Generation: NONREACTIVE

## 2021-04-16 ENCOUNTER — Other Ambulatory Visit: Payer: Self-pay | Admitting: Family

## 2021-04-21 ENCOUNTER — Encounter: Payer: Self-pay | Admitting: Family Medicine

## 2021-04-21 ENCOUNTER — Ambulatory Visit (INDEPENDENT_AMBULATORY_CARE_PROVIDER_SITE_OTHER): Payer: 59 | Admitting: Family Medicine

## 2021-04-21 ENCOUNTER — Other Ambulatory Visit: Payer: Self-pay

## 2021-04-21 VITALS — BP 150/72 | HR 56 | Ht 66.0 in | Wt 318.0 lb

## 2021-04-21 DIAGNOSIS — Z6841 Body Mass Index (BMI) 40.0 and over, adult: Secondary | ICD-10-CM

## 2021-04-21 DIAGNOSIS — E1169 Type 2 diabetes mellitus with other specified complication: Secondary | ICD-10-CM

## 2021-04-21 DIAGNOSIS — E669 Obesity, unspecified: Secondary | ICD-10-CM | POA: Diagnosis not present

## 2021-04-21 DIAGNOSIS — N950 Postmenopausal bleeding: Secondary | ICD-10-CM | POA: Diagnosis not present

## 2021-04-21 MED ORDER — IBUPROFEN 600 MG PO TABS: 600.0000 mg | ORAL_TABLET | Freq: Four times a day (QID) | ORAL | 0 refills | Status: DC | PRN

## 2021-04-21 MED ORDER — OXYCODONE-ACETAMINOPHEN 5-325 MG PO TABS: 1.0000 | ORAL_TABLET | ORAL | 0 refills | Status: DC | PRN

## 2021-04-21 NOTE — Progress Notes (Signed)
GYNECOLOGY NEW PATIENT ENCOUNTER NOTE  Subjective:   Sheila Carlson is a 60 y.o. [phone] female here for gynecologic exam.  Current complaints: post-menopausal vaginal bleeding. Had bleeding that lasted 3 days last month. Appeared like a normal period. Is sexually active (heterosexual), but no sex proximal to bleeding. No recent COVID infection or vaccination. Started menopause approximately 3 years ago.     Gynecologic History No LMP recorded. Patient is postmenopausal. Patient is sexually active  Contraception: post menopausal status Last Pap: 05/11/20. Results were: normal Last mammogram: 05/18/20. Results were: normal  Obstetric History OB History  Gravida Para Term Preterm AB Living  4 2 2   2     SAB IAB Ectopic Multiple Live Births    2     2    # Outcome Date GA Lbr Len/2nd Weight Sex Delivery Anes PTL Lv  4 IAB           3 IAB           2 Term           1 Term             Past Medical History:  Diagnosis Date  . COVID-19 04/2020  . Diabetes type 2, controlled ([place]) 04/29/2020  . History of low potassium   . Hyperlipidemia 04/16/2020  . Hypertension     Past Surgical History:  Procedure Laterality Date  . CESAREAN SECTION      Current Outpatient Medications on File Prior to Visit  Medication Sig Dispense Refill  . amLODipine (NORVASC) 10 MG tablet Take 1 tablet (10 mg total) by mouth daily. 30 tablet 0  . atorvastatin (LIPITOR) 20 MG tablet Take 1 tablet (20 mg total) by mouth daily. 90 tablet 0  . hydrochlorothiazide (HYDRODIURIL) 25 MG tablet Take 1 tablet (25 mg total) by mouth daily. 90 tablet 1  . lisinopril (ZESTRIL) 40 MG tablet Take 1 tablet (40 mg total) by mouth daily. 90 tablet 0  . metoprolol succinate (TOPROL-XL) 50 MG 24 hr tablet TAKE 1 TABLET(50 MG) BY MOUTH DAILY WITH OR IMMEDIATELY FOLLOWING A MEAL 90 tablet 1  . potassium chloride SA (KLOR-CON) 20 MEQ tablet Take 1 tablet (20 mEq total) by mouth daily. 90 tablet 0  . triamcinolone lotion  (KENALOG) 0.1 % Apply 1 application topically 3 (three) times daily. 120 mL 0  . Vitamin D, Ergocalciferol, (DRISDOL) 1.25 MG (50000 UNIT) CAPS capsule Take 1 capsule (50,000 Units total) by mouth every 7 (seven) days. 4 capsule 0   No current facility-administered medications on file prior to visit.    Allergies  Allergen Reactions  . Hydrocodone     Causes hallucinations.  Patient states she can tolerate oxycodone.  [name] Penicillins Swelling    Social History   Socioeconomic History  . Marital status: Married    Spouse name: Awanda Mink  . Number of children: 2  . Years of education: Not on file  . Highest education level: Not on file  Occupational History  . 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 Smoking status: Never Smoker  . Smokeless tobacco: Never Used  Vaping Use  . Vaping Use: Never used  Substance and Sexual Activity  . Alcohol use: Yes    Comment: occ  . Drug use: No  . Sexual activity: Yes    Partners: Male  Other Topics Concern  . Not on file  Social History Narrative   Patient has 2 biological sons  2 adopted children (son, daughter)   Hotel manager   Married (second married)   Completed bachelors in early childhood   No pets   Enjoys Corporate investment banker booking, decorating, shopping   Social Determinants of Health   Financial Resource Strain: Not on file  Food Insecurity: Not on file  Transportation Needs: Not on file  Physical Activity: Not on file  Stress: Not on file  Social Connections: Not on file  Intimate Partner Violence: Not on file    Family History  Problem Relation Age of Onset  . Hypertension Mother   . Diabetes Mother   . High Cholesterol Mother   . Alcoholism Mother   . Obesity Mother   . Alcohol abuse Father   . Hypertension Father   . Asthma Sister   . Colitis Sister        pt unsure    The following portions of the patient's history were reviewed and updated as appropriate: allergies, current medications, past family  history, past medical history, past social history, past surgical history and problem list.  Review of Systems Pertinent items are noted in HPI.   Objective:  BP (!) 150/72   Pulse (!) 56   Ht 5\' 6"  (1.676 m)   Wt (!) 318 lb (144.2 kg)   BMI 51.33 kg/m  Wt Readings from Last 3 Encounters:  04/21/21 (!) 318 lb (144.2 kg)  03/21/21 (!) 316 lb (143.3 kg)  03/11/21 (!) 319 lb (144.7 kg)     Chaperone present during exam  CONSTITUTIONAL: Well-developed, well-nourished female in no acute distress.  HENT:  Normocephalic, atraumatic, External right and left ear normal. Oropharynx is clear and moist EYES: Conjunctivae and EOM are normal. Pupils are equal, round, and reactive to light. No scleral icterus.  NECK: Normal range of motion, supple, no masses.  Normal thyroid.   CARDIOVASCULAR: Normal heart rate noted, regular rhythm RESPIRATORY: Clear to auscultation bilaterally. Effort and breath sounds normal, no problems with respiration noted. ABDOMEN: Soft, normal bowel sounds, no distention noted.  No tenderness, rebound or guarding.  PELVIC: Normal appearing external genitalia; normal appearing vaginal mucosa and cervix.  No abnormal discharge noted.  Normal uterine size, no other palpable masses, no uterine or adnexal tenderness. MUSCULOSKELETAL: Normal range of motion. No tenderness.  No cyanosis, clubbing, or edema.  2+ distal pulses. SKIN: Skin is warm and dry. No rash noted. Not diaphoretic. No erythema. No pallor. NEUROLOGIC: Alert and oriented to person, place, and time. Normal reflexes, muscle tone coordination. No cranial nerve deficit noted. PSYCHIATRIC: Normal mood and affect. Normal behavior. Normal judgment and thought content.  Assessment:  Sheila Carlson is a 60 y.o. post-menopausal female who presented for postmenopausal bleeding.    Plan:  1. Post-menopausal bleeding Will need endometrial sampling as patient high risk with obesity and diabetes. Will get transvaginal US  to evaluate endometrium. Will have patient return in 1-2 weeks for endometrial biopsy. Discussed premedication with percocet and Ibuprofen 30 minutes prior to procedure. Patient will need someone to drive her to and from the appointment. - US PELVIC COMPLETE WITH TRANSVAGINAL; Future  2. Diabetes mellitus type 2 in obese (HCC)  3. Class 3 severe obesity due to excess calories with serious comorbidity and body mass index (BMI) of 50.0 to 59.9 in adult [facility])  Truett Mainland, DO

## 2021-04-21 NOTE — Progress Notes (Signed)
Patient reports that she had one episode of bleeding. Patient state the bleeding was like a regular period and lasted three days.. She has been PMP for approx three years. Kathrene Alu RN

## 2021-05-02 ENCOUNTER — Other Ambulatory Visit: Payer: Self-pay

## 2021-05-02 ENCOUNTER — Ambulatory Visit (HOSPITAL_BASED_OUTPATIENT_CLINIC_OR_DEPARTMENT_OTHER)
Admission: RE | Admit: 2021-05-02 | Discharge: 2021-05-02 | Disposition: A | Discharge status: Home / Self Care | Payer: 59 | Source: Ambulatory Visit | Attending: Family Medicine | Admitting: Family Medicine

## 2021-05-02 DIAGNOSIS — N950 Postmenopausal bleeding: Secondary | ICD-10-CM | POA: Diagnosis present

## 2021-05-05 ENCOUNTER — Other Ambulatory Visit: Payer: Self-pay

## 2021-05-05 ENCOUNTER — Encounter: Payer: Self-pay | Admitting: Family Medicine

## 2021-05-05 ENCOUNTER — Ambulatory Visit (INDEPENDENT_AMBULATORY_CARE_PROVIDER_SITE_OTHER): Payer: 59 | Admitting: Family Medicine

## 2021-05-05 ENCOUNTER — Other Ambulatory Visit (HOSPITAL_COMMUNITY)
Admission: RE | Admit: 2021-05-05 | Discharge: 2021-05-05 | Disposition: A | Discharge status: Home / Self Care | Payer: 59 | Source: Ambulatory Visit | Attending: Family Medicine | Admitting: Family Medicine

## 2021-05-05 VITALS — BP 177/86 | HR 62 | Ht 66.0 in | Wt 321.0 lb

## 2021-05-05 DIAGNOSIS — C541 Malignant neoplasm of endometrium: Secondary | ICD-10-CM

## 2021-05-05 DIAGNOSIS — N950 Postmenopausal bleeding: Secondary | ICD-10-CM | POA: Diagnosis present

## 2021-05-05 NOTE — Patient Instructions (Signed)

## 2021-05-05 NOTE — Progress Notes (Signed)
ENDOMETRIAL BIOPSY     The indications for endometrial biopsy were reviewed.   Risks of the biopsy including cramping, bleeding, infection, uterine perforation, inadequate specimen and need for additional procedures  were discussed. The patient states she understands and agrees to undergo procedure today. Consent was signed. Time out was performed. Urine HCG was negative. A sterile speculum was placed in the patient's vagina and the cervix was prepped with Betadine. A single-toothed tenaculum was placed on the anterior lip of the cervix to stabilize it. The 3 mm pipelle was introduced into the endometrial cavity without difficulty to a depth of 9cm, and a moderate amount of tissue was obtained and sent to pathology. The instruments were removed from the patient's vagina. Minimal bleeding from the cervix was noted. The patient tolerated the procedure well. Routine post-procedure instructions were given to the patient. The patient will follow up to review the results and for further management.    

## 2021-05-10 ENCOUNTER — Encounter: Payer: Self-pay | Admitting: General Practice

## 2021-05-10 ENCOUNTER — Telehealth: Payer: Self-pay | Admitting: Oncology

## 2021-05-10 LAB — SURGICAL PATHOLOGY

## 2021-05-10 NOTE — Telephone Encounter (Signed)
Requested a FIGO grade on accession [phone] [phone] with Steffanie Dunn in [facility] Pathology.

## 2021-05-10 NOTE — Addendum Note (Signed)
Addended by: Truett Mainland on: 05/10/2021 01:51 PM   Modules accepted: Orders

## 2021-05-11 ENCOUNTER — Telehealth: Payer: Self-pay | Admitting: *Deleted

## 2021-05-11 NOTE — Telephone Encounter (Signed)
Spoke with the patient and gave appt information

## 2021-05-12 ENCOUNTER — Encounter: Payer: Self-pay | Admitting: Gynecologic Oncology

## 2021-05-13 ENCOUNTER — Encounter: Payer: Self-pay | Admitting: Gynecologic Oncology

## 2021-05-13 ENCOUNTER — Other Ambulatory Visit: Payer: Self-pay

## 2021-05-13 ENCOUNTER — Telehealth: Payer: Self-pay | Admitting: Family

## 2021-05-13 ENCOUNTER — Inpatient Hospital Stay: Payer: 59 | Attending: Gynecologic Oncology | Admitting: Gynecologic Oncology

## 2021-05-13 VITALS — BP 190/96 | HR 75 | Temp 97.0°F | Ht 66.0 in | Wt 321.5 lb

## 2021-05-13 DIAGNOSIS — Z79899 Other long term (current) drug therapy: Secondary | ICD-10-CM | POA: Diagnosis not present

## 2021-05-13 DIAGNOSIS — C541 Malignant neoplasm of endometrium: Secondary | ICD-10-CM | POA: Diagnosis not present

## 2021-05-13 DIAGNOSIS — E7800 Pure hypercholesterolemia, unspecified: Secondary | ICD-10-CM | POA: Insufficient documentation

## 2021-05-13 DIAGNOSIS — Z6841 Body Mass Index (BMI) 40.0 and over, adult: Secondary | ICD-10-CM | POA: Insufficient documentation

## 2021-05-13 DIAGNOSIS — I10 Essential (primary) hypertension: Secondary | ICD-10-CM | POA: Diagnosis not present

## 2021-05-13 DIAGNOSIS — E119 Type 2 diabetes mellitus without complications: Secondary | ICD-10-CM | POA: Insufficient documentation

## 2021-05-13 DIAGNOSIS — Z8616 Personal history of COVID-19: Secondary | ICD-10-CM | POA: Diagnosis not present

## 2021-05-13 NOTE — Patient Instructions (Addendum)
Preparing for your Surgery  Plan for surgery on May 24, 2021 with Dr. Everitt Amber at [facility] will be scheduled for a dilation and curettage of the uterus, Mirena intrauterine device placement.   Pre-operative Testing -You will receive a phone call from presurgical testing at [facility] to arrange for a pre-operative appointment and lab work.   -Bring your insurance card, copy of an advanced directive if applicable, medication list  -You should not be taking blood thinners or aspirin at least ten days prior to surgery unless instructed by your surgeon.  -Do not take supplements such as fish oil (omega 3), red yeast rice, turmeric before your surgery. You want to avoid medications with aspirin in them including headache powders such as BC or Goody's), Excedrin migraine.  Day Before Surgery at [facility] will be advised you can have clear liquids up until 3 hours before your surgery.    Your role in recovery Your role is to become active as soon as directed by your doctor, while still giving yourself time to heal.  Rest when you feel tired. You will be asked to do the following in order to speed your recovery:  - Cough and breathe deeply. This helps to clear and expand your lungs and can prevent pneumonia after surgery.  - [place]. Do mild physical activity. Walking or moving your legs help your circulation and body functions return to normal. Do not try to get up or walk alone the first time after surgery.   -If you develop swelling on one leg or the other, pain in the back of your leg, redness/warmth in one of your legs, please call the office or go to the Emergency Room to have a doppler to rule out a blood clot. For shortness of breath, chest pain-seek care in the Emergency Room as soon as possible. - Actively manage your pain. Managing your pain lets you move in comfort. We will ask you to rate your pain on a scale of zero to 10. It is your  responsibility to tell your doctor or nurse where and how much you hurt so your pain can be treated.  Special Considerations -Your final pathology results from surgery should be available around one week after surgery and the results will be relayed to you when available.  -FMLA forms can be faxed to [phone] and please allow 5-7 business days for completion.  Pain Management After Surgery -Make sure that you have Tylenol and Ibuprofen at home to use on a regular basis after surgery for pain control. We recommend alternating the medications every hour to six hours since they work differently and are processed in the body differently for pain relief.  Bowel Regimen -It is important to prevent constipation and drink adequate amounts of liquids. You can stop taking this medication when you are not taking pain medication and you are back on your normal bowel routine.  Risks of Surgery Risks of surgery are low but include bleeding, infection, damage to surrounding structures, re-operation, blood clots, and very rarely death.   AFTER SURGERY INSTRUCTIONS  Return to work: 1-2 days if applicable  Activity: 1. Be up and out of the bed during the day.  Take a nap if needed.  You may walk up steps but be careful and use the hand rail.  Stair climbing will tire you more than you think, you may need to stop part way and rest.   2. There are  no lifting restrictions.  3. No driving for minimum of 24 hours after the procedure.  Make sure that your reaction time has returned.   4. You can shower as soon as the next day after surgery. You are fine to take a tub bath.   5. No sexual activity and nothing in the vagina for 2 weeks.  6. You may experience vaginal spotting after surgery.  The spotting is normal but if you experience heavy bleeding, call our office.  9. Take Tylenol or ibuprofen for pain.  Monitor your Tylenol intake to a max of 4,000 mg in a 24 hour period. You can alternate these  medications after surgery.  Diet: 1. Low sodium Heart Healthy Diet is recommended but you are cleared to resume your normal (before surgery) diet after your procedure.  2. It is safe to use a laxative, such as Miralax or Colace, if you have difficulty moving your bowels.   Wound Care: 1. Keep clean and dry.  Shower daily.  Reasons to call the Doctor:  Fever - Oral temperature greater than 100.4 degrees Fahrenheit  Foul-smelling vaginal discharge  Difficulty urinating  Nausea and vomiting  Difficulty breathing with or without chest pain  New calf pain especially if only on one side  Sudden, continuing increased vaginal bleeding with or without clots.   Contacts: For questions or concerns you should contact:  Dr. Everitt Amber at [phone]  Joylene John, NP at [phone]  After Hours: call [phone] and have the GYN Oncologist paged/contacted (after 5 pm or on the weekends).  Messages sent via mychart are for non-urgent matters and are not responded to after hours so for urgent needs, please call the after hours number.

## 2021-05-13 NOTE — Telephone Encounter (Signed)
Pt's bp was very high at her GYN visit. Please schedule follow up visit with me and make sure she has refills on all her medicines.

## 2021-05-13 NOTE — Telephone Encounter (Signed)
Called patient, she reports she has all of her medications. She was scheduled to come in 05-17-2021

## 2021-05-13 NOTE — H&P (View-Only) (Signed)
Consult Note: Gyn-Onc  Consult was requested by Dr. Nehemiah Settle for the evaluation of Sheila Carlson 60 y.o. female  CC:  Chief Complaint  Patient presents with  . Endometrial ca [facility])    Assessment/Plan:  Sheila Carlson  is a 59 y.o.  year old P2 with grade 1 endometrial cancer.  A detailed discussion was held with the patient with regard to to her endometrial cancer diagnosis. We discussed the standard management options for uterine cancer (including definitive surgery with hysterectomy, BSO and SLN biopsy versus hormonal therapy with a progestin releasing IUD). The patient was been counseled about these options and the risks of surgery in general including infection, bleeding, damage to surrounding structures (including bowel, bladder, ureters, nerves or vessels), and the postoperative risks of PE/ DVT, and lymphedema. I explained that surgery with hysterectomy would be more definitive, however, would be associated with greater risk. Alternatively, D&C and IUD would be less definitive, with a 60-80% likelihood of resolution, but would carry with it less surgical risk and minimal recovery.  After counseling and consideration of her options, she is in agreement to proceed with D&C and progestin releasing IUD. We will plan on resampling at 3 months. If there has been resolution, we would not resample unless there was new bleeding. If there was persistent malignancy found at repeat sampling 3 months post IUD, we would continue 3 monthly biopsies until resolution had been demonstrated and/or proceed with hysterectomy as a definitive treatment if/when hormonal therapy has failed and she has ideally attempted successful weight loss.  HPI: Sheila Carlson is a 60 year old P2 who was seen in consultation at the request of Dr Nehemiah Settle for evaluation and treatment of grade 1 endometrial cancer.   Her symptoms began in March 2022, 3 years after menopause, with postmenopausal bleeding.  She saw her primary  care provider, Debbrah Alar, NP, for an established patient visit on 03/21/21.  Sheila Inda Castle recommended that she have gynecologic evaluation, because the patient did not have an established gynecologist she made a referral to Dr. Ursula Alert.  Dr. Nehemiah Settle saw the patient for new patient consultation on 04/21/2021. Work-up of symptoms included transvaginal ultrasound scan and endometrial biopsy.  Pap had been performed by Debbrah Alar, NP on 05/11/2020 the preceding year which was normal and negative for high-risk HPV. Transvaginal US on 05/02/2021 showed a uterus measuring 8.3 x 5 x 4.7 cm with an exophytic leiomyoma at the upper uterus measuring 4 x 4.4 cm.  The endometrium has a thickness of 2 mm in the lower uterine segment but the complex was obscured in the mid to upper uterine segment by a large anterior wall mass and a small posterior mass.  Which was suspected to be fibroids.. Endometrial sampling with endometrial Pipelle sampling in the office was performed on 05/05/2021 and showed focal endometrioid adenocarcinoma arising in a background of atypical hyperplasia.   The patient has a medical history significant for morbid obesity (class III) with a BMI of 52 kg per metered squared.  She has hypertension, hypercholesterolemia, and type 2 diabetes mellitus (the patient was unclear of this diagnosis and is not on any therapy including diet therapy for this) the patient's last hemoglobin A1c was 6.2% drawn in April 2022.  Her surgical history is remarkable for 2 prior cesarean sections.  She has had no vaginal deliveries.  Her gynecologic history is similarly remarkable for 2 prior cesarean sections.  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  They range in age between 97 months and 11 years.  Current Meds:  Outpatient Encounter Medications as of 05/13/2021  Medication Sig  .  amLODipine (NORVASC) 10 MG tablet Take 1 tablet (10 mg total) by mouth daily.  [name] atorvastatin (LIPITOR) 20 MG tablet Take 1 tablet (20 mg total) by mouth daily.  . hydrochlorothiazide (HYDRODIURIL) 25 MG tablet Take 1 tablet (25 mg total) by mouth daily.  [name] lisinopril (ZESTRIL) 40 MG tablet Take 1 tablet (40 mg total) by mouth daily.  . metoprolol succinate (TOPROL-XL) 50 MG 24 hr tablet TAKE 1 TABLET(50 MG) BY MOUTH DAILY WITH OR IMMEDIATELY FOLLOWING A MEAL  . potassium chloride SA (KLOR-CON) 20 MEQ tablet Take 1 tablet (20 mEq total) by mouth daily.  . Vitamin D, Ergocalciferol, (DRISDOL) 1.25 MG (50000 UNIT) CAPS capsule Take 1 capsule (50,000 Units total) by mouth every 7 (seven) days.  [name] ibuprofen (ADVIL) 600 MG tablet Take 1 tablet (600 mg total) by mouth every 6 (six) hours as needed. Take 1 tab 30 minutes prior to procedure (Patient not taking: No sig reported)  . oxyCODONE-acetaminophen (PERCOCET/ROXICET) 5-325 MG tablet Take 1 tablet by mouth every 4 (four) hours as needed for severe pain. Take 1 tab 30 minutes prior to procedure (Patient not taking: Reported on 05/12/2021)  . triamcinolone lotion (KENALOG) 0.1 % Apply 1 application topically 3 (three) times daily. (Patient not taking: Reported on 05/12/2021)   No facility-administered encounter medications on file as of 05/13/2021.    Allergy:  Allergies  Allergen Reactions  . Hydrocodone     Causes hallucinations.  Patient states she can tolerate oxycodone.  [name] Penicillins Swelling    Social Hx:   Social History   Socioeconomic History  . Marital status: Married    Spouse name: Awanda Mink  . Number of children: 2  . Years of education: Not on file  . Highest education level: Not on file  Occupational History  . 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 Smoking status: Never Smoker  . Smokeless tobacco: Never Used  Vaping Use  . Vaping Use: Never used  Substance and Sexual Activity  . Alcohol use: Not Currently  .  Drug use: No  . Sexual activity: Not Currently    Partners: Male  Other Topics Concern  . Not on file  Social History Narrative   Patient has 2 biological sons   2 adopted children (son, daughter)   Hotel manager   Married (second married)   Completed bachelors in early childhood   No pets   Enjoys Corporate investment banker booking, decorating, shopping   Social Determinants of Health   Financial Resource Strain: Not on file  Food Insecurity: Not on file  Transportation Needs: Not on file  Physical Activity: Not on file  Stress: Not on file  Social Connections: Not on file  Intimate Partner Violence: Not on file    Past Surgical Hx:  Past Surgical History:  Procedure Laterality Date  . CESAREAN SECTION      Past Medical Hx:  Past Medical History:  Diagnosis Date  . COVID-19 04/2020  . Diabetes type 2, controlled ([place]) 04/29/2020  . History of low potassium   . Hyperlipidemia 04/16/2020  . Hypertension     Past Gynecological History:  Cesarean section x 2 Patient's last menstrual period was 02/14/2016.  Family Hx:  Family History  Problem  Relation Age of Onset  . Hypertension Mother   . Diabetes Mother   . High Cholesterol Mother   . Alcoholism Mother   . Obesity Mother   . Alcohol abuse Father   . Hypertension Father   . Asthma Sister   . Colitis Sister        pt unsure    Review of Systems:  Constitutional  Feels well,    ENT Normal appearing ears and nares bilaterally Skin/Breast  No rash, sores, jaundice, itching, dryness Cardiovascular  No chest pain, shortness of breath, or edema  Pulmonary  No cough or wheeze.  Gastro Intestinal  No nausea, vomitting, or diarrhoea. No bright red blood per rectum, no abdominal pain, change in bowel movement, or constipation.  Genito Urinary  No frequency, urgency, dysuria, + postmenopausal bleeding Musculo Skeletal  No myalgia, arthralgia, joint swelling or pain  Neurologic  No weakness, numbness, change in gait,   Psychology  No depression, anxiety, insomnia.   Vitals:  Blood pressure (!) 190/96, pulse 75, temperature (!) 97 F (36.1 C), temperature source Tympanic, height 5\' 6"  (1.676 m), weight (!) 321 lb 8 oz (145.8 kg), last menstrual period 02/14/2016, SpO2 100 %.  Physical Exam: WD in NAD Neck  Supple NROM, without any enlargements.  Lymph Node Survey No cervical supraclavicular or inguinal adenopathy Cardiovascular  Pulse normal rate, regularity and rhythm. S1 and S2 normal.  Lungs  Clear to auscultation bilateraly, without wheezes/crackles/rhonchi. Good air movement.  Skin  No rash/lesions/breakdown  Psychiatry  Alert and oriented to person, place, and time  Abdomen  Normoactive bowel sounds, abdomen soft, non-tender and obese without evidence of hernia. No significant pannus, abdomen "lays out" in supine. Vertical midline incision from c/s's. Back No CVA tenderness Genito Urinary  Vulva/vagina: Normal external female genitalia.   No lesions. No discharge or bleeding.  Bladder/urethra:  No lesions or masses, well supported bladder  Vagina: normal  Cervix: Normal appearing, no lesions.  Uterus:  Small, mobile, no parametrial involvement or nodularity.  Adnexa: no palpable masses. Rectal  deferred Extremities  No bilateral cyanosis, clubbing or edema. Carries weight in her thighs making vaginal exam challenging.  60 minutes of total time was spent for this patient encounter, including preparation, face-to-face counseling with the patient and coordination of care, and documentation of the encounter.   Thereasa Solo, MD  05/13/2021, 10:26 AM

## 2021-05-13 NOTE — Progress Notes (Signed)
Consult Note: Gyn-Onc  Consult was requested by Dr. Nehemiah Settle for the evaluation of Sheila Carlson 60 y.o. female  CC:  Chief Complaint  Patient presents with  . Endometrial ca [facility])    Assessment/Plan:  Ms. Sheila Carlson  is a 59 y.o.  year old P2 with grade 1 endometrial cancer.  A detailed discussion was held with the patient with regard to to her endometrial cancer diagnosis. We discussed the standard management options for uterine cancer (including definitive surgery with hysterectomy, BSO and SLN biopsy versus hormonal therapy with a progestin releasing IUD). The patient was been counseled about these options and the risks of surgery in general including infection, bleeding, damage to surrounding structures (including bowel, bladder, ureters, nerves or vessels), and the postoperative risks of PE/ DVT, and lymphedema. I explained that surgery with hysterectomy would be more definitive, however, would be associated with greater risk. Alternatively, D&C and IUD would be less definitive, with a 60-80% likelihood of resolution, but would carry with it less surgical risk and minimal recovery.  After counseling and consideration of her options, she is in agreement to proceed with D&C and progestin releasing IUD. We will plan on resampling at 3 months. If there has been resolution, we would not resample unless there was new bleeding. If there was persistent malignancy found at repeat sampling 3 months post IUD, we would continue 3 monthly biopsies until resolution had been demonstrated and/or proceed with hysterectomy as a definitive treatment if/when hormonal therapy has failed and she has ideally attempted successful weight loss.  HPI: Ms Sheila Carlson is a 60 year old P2 who was seen in consultation at the request of Dr Nehemiah Settle for evaluation and treatment of grade 1 endometrial cancer.   Her symptoms began in March 2022, 3 years after menopause, with postmenopausal bleeding.  She saw her primary  care provider, Debbrah Alar, NP, for an established patient visit on 03/21/21.  Ms Inda Castle recommended that she have gynecologic evaluation, because the patient did not have an established gynecologist she made a referral to Dr. Ursula Alert.  Dr. Nehemiah Settle saw the patient for new patient consultation on 04/21/2021. Work-up of symptoms included transvaginal ultrasound scan and endometrial biopsy.  Pap had been performed by Debbrah Alar, NP on 05/11/2020 the preceding year which was normal and negative for high-risk HPV. Transvaginal US on 05/02/2021 showed a uterus measuring 8.3 x 5 x 4.7 cm with an exophytic leiomyoma at the upper uterus measuring 4 x 4.4 cm.  The endometrium has a thickness of 2 mm in the lower uterine segment but the complex was obscured in the mid to upper uterine segment by a large anterior wall mass and a small posterior mass.  Which was suspected to be fibroids.. Endometrial sampling with endometrial Pipelle sampling in the office was performed on 05/05/2021 and showed focal endometrioid adenocarcinoma arising in a background of atypical hyperplasia.   The patient has a medical history significant for morbid obesity (class III) with a BMI of 52 kg per metered squared.  She has hypertension, hypercholesterolemia, and type 2 diabetes mellitus (the patient was unclear of this diagnosis and is not on any therapy including diet therapy for this) the patient's last hemoglobin A1c was 6.2% drawn in April 2022.  Her surgical history is remarkable for 2 prior cesarean sections.  She has had no vaginal deliveries.  Her gynecologic history is similarly remarkable for 2 prior cesarean sections.  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  They range in age between 5 months and 11 years.  Current Meds:  Outpatient Encounter Medications as of 05/13/2021  Medication Sig  .  amLODipine (NORVASC) 10 MG tablet Take 1 tablet (10 mg total) by mouth daily.  . atorvastatin (LIPITOR) 20 MG tablet Take 1 tablet (20 mg total) by mouth daily.  . hydrochlorothiazide (HYDRODIURIL) 25 MG tablet Take 1 tablet (25 mg total) by mouth daily.  . lisinopril (ZESTRIL) 40 MG tablet Take 1 tablet (40 mg total) by mouth daily.  . metoprolol succinate (TOPROL-XL) 50 MG 24 hr tablet TAKE 1 TABLET(50 MG) BY MOUTH DAILY WITH OR IMMEDIATELY FOLLOWING A MEAL  . potassium chloride SA (KLOR-CON) 20 MEQ tablet Take 1 tablet (20 mEq total) by mouth daily.  . Vitamin D, Ergocalciferol, (DRISDOL) 1.25 MG (50000 UNIT) CAPS capsule Take 1 capsule (50,000 Units total) by mouth every 7 (seven) days.  . ibuprofen (ADVIL) 600 MG tablet Take 1 tablet (600 mg total) by mouth every 6 (six) hours as needed. Take 1 tab 30 minutes prior to procedure (Patient not taking: No sig reported)  . oxyCODONE-acetaminophen (PERCOCET/ROXICET) 5-325 MG tablet Take 1 tablet by mouth every 4 (four) hours as needed for severe pain. Take 1 tab 30 minutes prior to procedure (Patient not taking: Reported on 05/12/2021)  . triamcinolone lotion (KENALOG) 0.1 % Apply 1 application topically 3 (three) times daily. (Patient not taking: Reported on 05/12/2021)   No facility-administered encounter medications on file as of 05/13/2021.    Allergy:  Allergies  Allergen Reactions  . Hydrocodone     Causes hallucinations.  Patient states she can tolerate oxycodone.  . Penicillins Swelling    Social Hx:   Social History   Socioeconomic History  . Marital status: Married    Spouse name: Jerome  . Number of children: 2  . Years of education: Not on file  . Highest education level: Not on file  Occupational History  . 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 Smoking status: Never Smoker  . Smokeless tobacco: Never Used  Vaping Use  . Vaping Use: Never used  Substance and Sexual Activity  . Alcohol use: Not Currently  .  Drug use: No  . Sexual activity: Not Currently    Partners: Male  Other Topics Concern  . Not on file  Social History Narrative   Patient has 2 biological sons   2 adopted children (son, daughter)   Daycare worker   Married (second married)   Completed bachelors in early childhood   No pets   Enjoys scrap booking, decorating, shopping   Social Determinants of Health   Financial Resource Strain: Not on file  Food Insecurity: Not on file  Transportation Needs: Not on file  Physical Activity: Not on file  Stress: Not on file  Social Connections: Not on file  Intimate Partner Violence: Not on file    Past Surgical Hx:  Past Surgical History:  Procedure Laterality Date  . CESAREAN SECTION      Past Medical Hx:  Past Medical History:  Diagnosis Date  . COVID-19 04/2020  . Diabetes type 2, controlled ([facility]) 04/29/2020  . History of low potassium   . Hyperlipidemia 04/16/2020  . Hypertension     Past Gynecological History:  Cesarean section x 2 Patient's last menstrual period was 02/14/2016.  Family Hx:  Family History  Problem   Relation Age of Onset  . Hypertension Mother   . Diabetes Mother   . High Cholesterol Mother   . Alcoholism Mother   . Obesity Mother   . Alcohol abuse Father   . Hypertension Father   . Asthma Sister   . Colitis Sister        pt unsure    Review of Systems:  Constitutional  Feels well,    ENT Normal appearing ears and nares bilaterally Skin/Breast  No rash, sores, jaundice, itching, dryness Cardiovascular  No chest pain, shortness of breath, or edema  Pulmonary  No cough or wheeze.  Gastro Intestinal  No nausea, vomitting, or diarrhoea. No bright red blood per rectum, no abdominal pain, change in bowel movement, or constipation.  Genito Urinary  No frequency, urgency, dysuria, + postmenopausal bleeding Musculo Skeletal  No myalgia, arthralgia, joint swelling or pain  Neurologic  No weakness, numbness, change in gait,   Psychology  No depression, anxiety, insomnia.   Vitals:  Blood pressure (!) 190/96, pulse 75, temperature (!) 97 F (36.1 C), temperature source Tympanic, height 5' 6" (1.676 m), weight (!) 321 lb 8 oz (145.8 kg), last menstrual period 02/14/2016, SpO2 100 %.  Physical Exam: WD in NAD Neck  Supple NROM, without any enlargements.  Lymph Node Survey No cervical supraclavicular or inguinal adenopathy Cardiovascular  Pulse normal rate, regularity and rhythm. S1 and S2 normal.  Lungs  Clear to auscultation bilateraly, without wheezes/crackles/rhonchi. Good air movement.  Skin  No rash/lesions/breakdown  Psychiatry  Alert and oriented to person, place, and time  Abdomen  Normoactive bowel sounds, abdomen soft, non-tender and obese without evidence of hernia. No significant pannus, abdomen "lays out" in supine. Vertical midline incision from c/s's. Back No CVA tenderness Genito Urinary  Vulva/vagina: Normal external female genitalia.   No lesions. No discharge or bleeding.  Bladder/urethra:  No lesions or masses, well supported bladder  Vagina: normal  Cervix: Normal appearing, no lesions.  Uterus:  Small, mobile, no parametrial involvement or nodularity.  Adnexa: no palpable masses. Rectal  deferred Extremities  No bilateral cyanosis, clubbing or edema. Carries weight in her thighs making vaginal exam challenging.  60 minutes of total time was spent for this patient encounter, including preparation, face-to-face counseling with the patient and coordination of care, and documentation of the encounter.   Bronwyn Belasco C Lillyian Heidt, MD  05/13/2021, 10:26 AM     

## 2021-05-14 ENCOUNTER — Other Ambulatory Visit: Payer: Self-pay | Admitting: Family

## 2021-05-16 NOTE — Patient Instructions (Signed)
DUE TO COVID-19 ONLY ONE VISITOR IS ALLOWED TO COME WITH YOU AND STAY IN THE WAITING ROOM ONLY DURING PRE OP AND PROCEDURE DAY OF SURGERY. THE 1 VISITOR  MAY VISIT WITH YOU AFTER SURGERY IN YOUR PRIVATE ROOM DURING VISITING HOURS ONLY!                Sheila Carlson   Your procedure is scheduled on: 05/24/21   Report to [facility] Main  Entrance   Report to admitting at: 8:00 AM     Call this number if you have problems the morning of surgery [phone]    Remember: Do not eat solid food :After Midnight. Clear liquids until: 7:00 AM.  BRUSH YOUR TEETH MORNING OF SURGERY AND RINSE YOUR MOUTH OUT, NO CHEWING GUM CANDY OR MINTS.    Take these medicines the morning of surgery with A SIP OF WATER: metropolol,amlodipine.                                You may not have any metal on your body including hair pins and              piercings  Do not wear jewelry, make-up, lotions, powders or perfumes, deodorant             Do not wear nail polish on your fingernails.  Do not shave  48 hours prior to surgery.    Do not bring valuables to the hospital. [place].  Contacts, dentures or bridgework may not be worn into surgery.  Leave suitcase in the car. After surgery it may be brought to your room.     Patients discharged the day of surgery will not be allowed to drive home. IF YOU ARE HAVING SURGERY AND GOING HOME THE SAME DAY, YOU MUST HAVE AN ADULT TO DRIVE YOU HOME AND BE WITH YOU FOR 24 HOURS. YOU MAY GO HOME BY TAXI OR UBER OR ORTHERWISE, BUT AN ADULT MUST ACCOMPANY YOU HOME AND STAY WITH YOU FOR 24 HOURS.  Name and phone number of your driver:  Special Instructions: N/A              Please read over the following fact sheets you were given: _____________________________________________________________________         [facility] - Preparing for Surgery Before surgery, you can play an important role.  Because skin is not  sterile, your skin needs to be as free of germs as possible.  You can reduce the number of germs on your skin by washing with CHG (chlorahexidine gluconate) soap before surgery.  CHG is an antiseptic cleaner which kills germs and bonds with the skin to continue killing germs even after washing. Please DO NOT use if you have an allergy to CHG or antibacterial soaps.  If your skin becomes reddened/irritated stop using the CHG and inform your nurse when you arrive at Short Stay. Do not shave (including legs and underarms) for at least 48 hours prior to the first CHG shower.  You may shave your face/neck. Please follow these instructions carefully:  1.  Shower with CHG Soap the night before surgery and the  morning of Surgery.  2.  If you choose to wash your hair, wash your hair first as usual with your  normal  shampoo.  3.  After you shampoo, rinse your hair and body thoroughly to remove the  shampoo.                           4.  Use CHG as you would any other liquid soap.  You can apply chg directly  to the skin and wash                       Gently with a scrungie or clean washcloth.  5.  Apply the CHG Soap to your body ONLY FROM THE NECK DOWN.   Do not use on face/ open                           Wound or open sores. Avoid contact with eyes, ears mouth and genitals (private parts).                       Wash face,  Genitals (private parts) with your normal soap.             6.  Wash thoroughly, paying special attention to the area where your surgery  will be performed.  7.  Thoroughly rinse your body with warm water from the neck down.  8.  DO NOT shower/wash with your normal soap after using and rinsing off  the CHG Soap.                9.  Pat yourself dry with a clean towel.            10.  Wear clean pajamas.            11.  Place clean sheets on your bed the night of your first shower and do not  sleep with pets. Day of Surgery : Do not apply any lotions/deodorants the morning of surgery.   Please wear clean clothes to the hospital/surgery center.  FAILURE TO FOLLOW THESE INSTRUCTIONS MAY RESULT IN THE CANCELLATION OF YOUR SURGERY PATIENT SIGNATURE_________________________________  NURSE SIGNATURE__________________________________  ________________________________________________________________________

## 2021-05-17 ENCOUNTER — Encounter (HOSPITAL_COMMUNITY): Payer: Self-pay

## 2021-05-17 ENCOUNTER — Encounter (HOSPITAL_COMMUNITY)
Admission: RE | Admit: 2021-05-17 | Discharge: 2021-05-17 | Disposition: A | Discharge status: Home / Self Care | Payer: 59 | Source: Ambulatory Visit | Attending: Gynecologic Oncology | Admitting: Gynecologic Oncology

## 2021-05-17 ENCOUNTER — Ambulatory Visit: Payer: 59 | Admitting: Family

## 2021-05-17 ENCOUNTER — Other Ambulatory Visit: Payer: Self-pay

## 2021-05-17 DIAGNOSIS — Z01818 Encounter for other preprocedural examination: Secondary | ICD-10-CM | POA: Insufficient documentation

## 2021-05-17 HISTORY — DX: Prediabetes: R73.03

## 2021-05-17 HISTORY — DX: Malignant (primary) neoplasm, unspecified: C80.1

## 2021-05-17 HISTORY — DX: Anemia, unspecified: D64.9

## 2021-05-17 LAB — CBC
HCT: 39.3 % (ref 36.0–46.0)
Hemoglobin: 12.7 g/dL (ref 12.0–15.0)
MCH: 28.5 pg (ref 26.0–34.0)
MCHC: 32.3 g/dL (ref 30.0–36.0)
MCV: 88.3 fL (ref 80.0–100.0)
Platelets: 344 10*3/uL (ref 150–400)
RBC: 4.45 MIL/uL (ref 3.87–5.11)
RDW: 13.9 % (ref 11.5–15.5)
WBC: 4.6 10*3/uL (ref 4.0–10.5)
nRBC: 0 % (ref 0.0–0.2)

## 2021-05-17 LAB — BASIC METABOLIC PANEL
Anion gap: 7 (ref 5–15)
BUN: 18 mg/dL (ref 8–23)
CO2: 27 mmol/L (ref 22–32)
Calcium: 8.8 mg/dL — ABNORMAL LOW (ref 8.9–10.3)
Chloride: 104 mmol/L (ref 98–111)
Creatinine, Ser: 0.75 mg/dL (ref 0.44–1.00)
GFR, Estimated: >60 mL/min (ref >60)
Glucose, Bld: 126 mg/dL — ABNORMAL HIGH (ref 70–99)
Potassium: 3.6 mmol/L (ref 3.5–5.1)
Sodium: 138 mmol/L (ref 135–145)

## 2021-05-17 NOTE — Progress Notes (Signed)
COVID Vaccine Completed: Yes Date COVID Vaccine completed: 03/29/20 COVID vaccine manufacturer: [place]     PCP - Lenna Sciara O'Sillivan: NP Cardiologist - No  Chest x-ray -  EKG -  Stress Test -  ECHO -  Cardiac Cath -  Pacemaker/ICD device last checked: A1-C: 6.8: 04/12/21: EPIC Sleep Study -  CPAP -   Fasting Blood Sugar -  Checks Blood Sugar _____ times a day  Blood Thinner Instructions: Aspirin Instructions: Last Dose:  Anesthesia review: Hx: HTN,Pre-DIA  Patient denies shortness of breath, fever, cough and chest pain at PAT appointment   Patient verbalized understanding of instructions that were given to them at the PAT appointment. Patient was also instructed that they will need to review over the PAT instructions again at home before surgery.

## 2021-05-19 ENCOUNTER — Telehealth: Payer: Self-pay

## 2021-05-19 NOTE — Telephone Encounter (Signed)
Sheila Carlson states that she understands her written pre-op instructions dated 05-17-21.  No questions or concerns noted at this time.

## 2021-05-24 ENCOUNTER — Ambulatory Visit (HOSPITAL_COMMUNITY): Payer: 59 | Admitting: Anesthesiology

## 2021-05-24 ENCOUNTER — Encounter (HOSPITAL_COMMUNITY)
Admission: RE | Disposition: A | Discharge status: Home / Self Care | Payer: Self-pay | Source: Home / Self Care | Attending: Gynecologic Oncology

## 2021-05-24 ENCOUNTER — Ambulatory Visit (HOSPITAL_COMMUNITY)
Admission: RE | Admit: 2021-05-24 | Discharge: 2021-05-24 | Disposition: A | Discharge status: Home / Self Care | Payer: 59 | Attending: Gynecologic Oncology | Admitting: Gynecologic Oncology

## 2021-05-24 ENCOUNTER — Encounter (HOSPITAL_COMMUNITY): Payer: Self-pay | Admitting: Gynecologic Oncology

## 2021-05-24 DIAGNOSIS — Z8349 Family history of other endocrine, nutritional and metabolic diseases: Secondary | ICD-10-CM | POA: Insufficient documentation

## 2021-05-24 DIAGNOSIS — Z8616 Personal history of COVID-19: Secondary | ICD-10-CM | POA: Insufficient documentation

## 2021-05-24 DIAGNOSIS — Z98891 History of uterine scar from previous surgery: Secondary | ICD-10-CM | POA: Diagnosis not present

## 2021-05-24 DIAGNOSIS — Z833 Family history of diabetes mellitus: Secondary | ICD-10-CM | POA: Insufficient documentation

## 2021-05-24 DIAGNOSIS — I10 Essential (primary) hypertension: Secondary | ICD-10-CM | POA: Diagnosis not present

## 2021-05-24 DIAGNOSIS — Z880 Allergy status to penicillin: Secondary | ICD-10-CM | POA: Insufficient documentation

## 2021-05-24 DIAGNOSIS — Z79899 Other long term (current) drug therapy: Secondary | ICD-10-CM | POA: Insufficient documentation

## 2021-05-24 DIAGNOSIS — C541 Malignant neoplasm of endometrium: Secondary | ICD-10-CM | POA: Diagnosis present

## 2021-05-24 DIAGNOSIS — N950 Postmenopausal bleeding: Secondary | ICD-10-CM | POA: Insufficient documentation

## 2021-05-24 DIAGNOSIS — E119 Type 2 diabetes mellitus without complications: Secondary | ICD-10-CM | POA: Diagnosis not present

## 2021-05-24 DIAGNOSIS — E785 Hyperlipidemia, unspecified: Secondary | ICD-10-CM | POA: Diagnosis not present

## 2021-05-24 DIAGNOSIS — Z6841 Body Mass Index (BMI) 40.0 and over, adult: Secondary | ICD-10-CM | POA: Diagnosis not present

## 2021-05-24 DIAGNOSIS — Z885 Allergy status to narcotic agent status: Secondary | ICD-10-CM | POA: Insufficient documentation

## 2021-05-24 DIAGNOSIS — Z8249 Family history of ischemic heart disease and other diseases of the circulatory system: Secondary | ICD-10-CM | POA: Insufficient documentation

## 2021-05-24 HISTORY — PX: INTRAUTERINE DEVICE (IUD) INSERTION: SHX5877

## 2021-05-24 HISTORY — PX: DILATION AND CURETTAGE OF UTERUS: SHX78

## 2021-05-24 LAB — GLUCOSE, CAPILLARY: Glucose-Capillary: 125 mg/dL — ABNORMAL HIGH (ref 70–99)

## 2021-05-24 SURGERY — DILATION AND CURETTAGE
Anesthesia: General

## 2021-05-24 MED ORDER — CHLORHEXIDINE GLUCONATE 0.12 % MT SOLN
15.0000 mL | Freq: Once | OROMUCOSAL | Status: AC
  Administered 2021-05-24: 15 mL via OROMUCOSAL

## 2021-05-24 MED ORDER — ACETAMINOPHEN 500 MG PO TABS
1000.0000 mg | ORAL_TABLET | ORAL | Status: AC
  Administered 2021-05-24: 1000 mg via ORAL
  Filled 2021-05-24: qty 2

## 2021-05-24 MED ORDER — ORAL CARE MOUTH RINSE: 15.0000 mL | Freq: Once | OROMUCOSAL | Status: AC

## 2021-05-24 MED ORDER — PROMETHAZINE HCL 25 MG/ML IJ SOLN
6.2500 mg | INTRAMUSCULAR | Status: DC | PRN
Start: 2021-05-24 — End: 2021-05-24

## 2021-05-24 MED ORDER — 0.9 % SODIUM CHLORIDE (POUR BTL) OPTIME
TOPICAL | Status: DC | PRN
  Administered 2021-05-24: 1000 mL

## 2021-05-24 MED ORDER — LEVONORGESTREL 20 MCG/DAY IU IUD
1.0000 | INTRAUTERINE_SYSTEM | INTRAUTERINE | Status: AC
  Administered 2021-05-24: 1 via INTRAUTERINE
  Filled 2021-05-24: qty 1

## 2021-05-24 MED ORDER — FENTANYL CITRATE (PF) 100 MCG/2ML IJ SOLN
INTRAMUSCULAR | Status: AC
  Filled 2021-05-24: qty 2

## 2021-05-24 MED ORDER — DEXAMETHASONE SODIUM PHOSPHATE 10 MG/ML IJ SOLN
INTRAMUSCULAR | Status: DC | PRN
  Administered 2021-05-24: 10 mg via INTRAVENOUS

## 2021-05-24 MED ORDER — ACETAMINOPHEN 500 MG PO TABS: 1000.0000 mg | ORAL_TABLET | Freq: Once | ORAL | Status: DC

## 2021-05-24 MED ORDER — PROPOFOL 10 MG/ML IV BOLUS
INTRAVENOUS | Status: AC
  Filled 2021-05-24: qty 20

## 2021-05-24 MED ORDER — MIDAZOLAM HCL 2 MG/2ML IJ SOLN
INTRAMUSCULAR | Status: AC
  Filled 2021-05-24: qty 2

## 2021-05-24 MED ORDER — FENTANYL CITRATE (PF) 100 MCG/2ML IJ SOLN: 25.0000 ug | INTRAMUSCULAR | Status: DC | PRN

## 2021-05-24 MED ORDER — KETOROLAC TROMETHAMINE 30 MG/ML IJ SOLN
INTRAMUSCULAR | Status: AC
  Filled 2021-05-24: qty 1

## 2021-05-24 MED ORDER — SCOPOLAMINE 1 MG/3DAYS TD PT72
1.0000 | MEDICATED_PATCH | TRANSDERMAL | Status: DC
Start: 2021-05-24 — End: 2021-05-24
  Administered 2021-05-24: 1.5 mg via TRANSDERMAL
  Filled 2021-05-24: qty 1

## 2021-05-24 MED ORDER — ONDANSETRON HCL 4 MG/2ML IJ SOLN
INTRAMUSCULAR | Status: DC | PRN
  Administered 2021-05-24: 4 mg via INTRAVENOUS

## 2021-05-24 MED ORDER — LIDOCAINE 2% (20 MG/ML) 5 ML SYRINGE
INTRAMUSCULAR | Status: DC | PRN
  Administered 2021-05-24 (×2): 100 mg via INTRAVENOUS

## 2021-05-24 MED ORDER — MIDAZOLAM HCL 5 MG/5ML IJ SOLN
INTRAMUSCULAR | Status: DC | PRN
  Administered 2021-05-24: 2 mg via INTRAVENOUS

## 2021-05-24 MED ORDER — FENTANYL CITRATE (PF) 100 MCG/2ML IJ SOLN
INTRAMUSCULAR | Status: DC | PRN
  Administered 2021-05-24: 25 ug via INTRAVENOUS

## 2021-05-24 MED ORDER — LACTATED RINGERS IV SOLN: INTRAVENOUS | Status: DC

## 2021-05-24 MED ORDER — OXYCODONE HCL 5 MG PO TABS: 5.0000 mg | ORAL_TABLET | Freq: Once | ORAL | Status: DC | PRN

## 2021-05-24 MED ORDER — PROPOFOL 10 MG/ML IV BOLUS
INTRAVENOUS | Status: DC | PRN
  Administered 2021-05-24: 200 mg via INTRAVENOUS

## 2021-05-24 MED ORDER — LIDOCAINE 2% (20 MG/ML) 5 ML SYRINGE
INTRAMUSCULAR | Status: AC
  Filled 2021-05-24: qty 5

## 2021-05-24 MED ORDER — CELECOXIB 200 MG PO CAPS
400.0000 mg | ORAL_CAPSULE | ORAL | Status: AC
Start: 2021-05-24 — End: 2021-05-24
  Administered 2021-05-24: 400 mg via ORAL
  Filled 2021-05-24: qty 2

## 2021-05-24 MED ORDER — LIDOCAINE-EPINEPHRINE (PF) 1 %-1:200000 IJ SOLN
INTRAMUSCULAR | Status: AC
  Filled 2021-05-24: qty 30

## 2021-05-24 MED ORDER — DEXAMETHASONE SODIUM PHOSPHATE 10 MG/ML IJ SOLN
INTRAMUSCULAR | Status: AC
  Filled 2021-05-24: qty 1

## 2021-05-24 MED ORDER — OXYCODONE HCL 5 MG/5ML PO SOLN: 5.0000 mg | Freq: Once | ORAL | Status: DC | PRN

## 2021-05-24 MED ORDER — ONDANSETRON HCL 4 MG/2ML IJ SOLN
INTRAMUSCULAR | Status: AC
  Filled 2021-05-24: qty 2

## 2021-05-24 MED ORDER — SILVER NITRATE-POT NITRATE 75-25 % EX MISC
CUTANEOUS | Status: AC
  Filled 2021-05-24: qty 10

## 2021-05-24 MED ORDER — KETOROLAC TROMETHAMINE 30 MG/ML IJ SOLN
30.0000 mg | Freq: Once | INTRAMUSCULAR | Status: AC
Start: 2021-05-24 — End: 2021-05-24
  Administered 2021-05-24: 30 mg via INTRAVENOUS

## 2021-05-24 SURGICAL SUPPLY — 25 items
CATH ROBINSON RED A/P 16FR (CATHETERS) ×2 IMPLANT
COVER SURGICAL LIGHT HANDLE (MISCELLANEOUS) ×2 IMPLANT
COVER WAND RF STERILE (DRAPES) IMPLANT
DRAPE SHEET LG 3/4 BI-LAMINATE (DRAPES) ×4 IMPLANT
DRAPE UNDERBUTTOCKS STRL (DISPOSABLE) ×2 IMPLANT
DRSG TELFA 3X8 NADH (GAUZE/BANDAGES/DRESSINGS) ×2 IMPLANT
GAUZE 4X4 16PLY RFD (DISPOSABLE) ×2 IMPLANT
GLOVE SURG ENC MOIS LTX SZ6 (GLOVE) ×4 IMPLANT
GOWN STRL REUS W/ TWL LRG LVL3 (GOWN DISPOSABLE) ×2 IMPLANT
GOWN STRL REUS W/TWL LRG LVL3 (GOWN DISPOSABLE) ×4
KIT BASIN OR (CUSTOM PROCEDURE TRAY) ×2 IMPLANT
KIT TURNOVER KIT A (KITS) ×2 IMPLANT
Mirena levonorgtrel-releasing intrauteine system ×2 IMPLANT
NEEDLE SPNL 22GX3.5 QUINCKE BK (NEEDLE) IMPLANT
PACK LITHOTOMY IV (CUSTOM PROCEDURE TRAY) ×2 IMPLANT
PAD OB MATERNITY 4.3X12.25 (PERSONAL CARE ITEMS) ×2 IMPLANT
PENCIL SMOKE EVACUATOR (MISCELLANEOUS) IMPLANT
SOL PREP PROV IODINE SCRUB 4OZ (MISCELLANEOUS) ×2 IMPLANT
SURGILUBE 2OZ TUBE FLIPTOP (MISCELLANEOUS) ×2 IMPLANT
SUT VIC AB 0 CT1 27 (SUTURE) ×2
SUT VIC AB 0 CT1 27XBRD ANTBC (SUTURE) ×1 IMPLANT
SYR BULB IRRIG 60ML STRL (SYRINGE) ×2 IMPLANT
TOWEL OR 17X26 10 PK STRL BLUE (TOWEL DISPOSABLE) ×2 IMPLANT
TOWEL OR NON WOVEN STRL DISP B (DISPOSABLE) ×2 IMPLANT
UNDERPAD 30X36 HEAVY ABSORB (UNDERPADS AND DIAPERS) ×4 IMPLANT

## 2021-05-24 NOTE — Anesthesia Preprocedure Evaluation (Addendum)
Anesthesia Evaluation  Patient identified by MRN, date of birth, ID band Patient awake    Reviewed: Allergy & Precautions, NPO status , Patient's Chart, lab work & pertinent test results  Airway Mallampati: II  TM Distance: >3 FB Neck ROM: Full    Dental no notable dental hx. (+)    Pulmonary neg pulmonary ROS,    Pulmonary exam normal breath sounds clear to auscultation       Cardiovascular Exercise Tolerance: Good hypertension, Normal cardiovascular exam Rhythm:Regular Rate:Normal  Normal sinus rhythm with sinus arrhythmia Otherwise normal ECG No significant change since last tracing Confirmed by Virgina Jock, Manish ([id number]) on 05/17/2021 4:15:06 PM   Neuro/Psych negative neurological ROS     GI/Hepatic GERD  ,  Endo/Other  diabetesMorbid obesityBMI 50  Renal/GU   negative genitourinary   Musculoskeletal negative musculoskeletal ROS (+)   Abdominal (+) + obese,   Peds negative pediatric ROS (+)  Hematology   Anesthesia Other Findings Endometrial cancer  Reproductive/Obstetrics negative OB ROS                            Anesthesia Physical Anesthesia Plan  ASA: IV  Anesthesia Plan: General   Post-op Pain Management:    Induction: Intravenous  PONV Risk Score and Plan: 3 and Midazolam, Treatment may vary due to age or medical condition, Ondansetron and Dexamethasone  Airway Management Planned: Oral ETT  Additional Equipment: None  Intra-op Plan:   Post-operative Plan: Extubation in OR  Informed Consent: I have reviewed the patients History and Physical, chart, labs and discussed the procedure including the risks, benefits and alternatives for the proposed anesthesia with the patient or authorized representative who has indicated his/her understanding and acceptance.     Dental advisory given  Plan Discussed with: Anesthesiologist and CRNA  Anesthesia Plan Comments:         Anesthesia Quick Evaluation

## 2021-05-24 NOTE — Discharge Instructions (Signed)
D&C Care After ACTIVITY  Rest as much as possible the first day after discharge.  You do not have weight restrictions on lifting  Avoid strenuous working out such as running or lifting weights for 48 hours  You can climb stairs and drive a car NUTRITION  You may resume your normal diet.  Drink 6 to 8 glasses of fluids a day.  Eat a healthy, balanced diet including portions of food from the meat (protein), milk, fruit, vegetable, and bread groups.  Your caregiver may recommend you take a multivitamin with iron.  ELIMINATION   If constipation occurs, drink more liquids, and add more fruits, vegetables, and bran to your diet. You may take a mild laxative, such as Milk of Magnesia, Metamucil, or a stool softener such as Colace, with permission from your caregiver.  HYGIENE  You may shower and wash your hair.  Avoid tub baths for 4 weeks  Do not add any bath oils or chemicals to your bath water, after you have permission to take baths.  Avoid placing anything in the vagina for 2 weeks.  It is normal to pass some blood (less than a period type bleeding) for up to a month  [place]   Take your temperature twice a day and record it, especially if you feel feverish or have chills.  Follow your caregiver's instructions about medicines, activity, and follow-up appointments after surgery.  Do not drink alcohol while taking pain medicine.  You may take over-the-counter medicine for pain, recommended by your caregiver.  If your pain is not relieved with medicine, call your caregiver.  Do not take aspirin because it can cause bleeding.  Do not douche or use tampons (use a nonperfumed sanitary pad).  Do not have sexual intercourse for 4 weeks postoperatively. Hugging, kissing, and playful sexual activity is fine with your caregiver's permission.  Take showers instead of baths, until your caregiver gives you permission to take baths.  You may take a mild medicine  for constipation, recommended by your caregiver. Bran foods and drinking a lot of fluids will help with constipation.  Make sure your family understands everything about your operation and recovery.  SEEK MEDICAL CARE IF:    You notice a foul smell coming from the vagina.  You have painful or bloody urination.  You develop nausea and vomiting.  You develop diarrhea.  You develop a rash.  You have a reaction or allergy from the medicine.  You feel dizzy or light-headed.  You need stronger pain medicine.   SEEK IMMEDIATE MEDICAL CARE IF:   You develop a temperature of 102 F (38.9 C) or higher.  You pass out.  You develop leg or chest pain.  You develop abdominal pain.  You develop shortness of breath.  You bleed heavier than a period (soaking through 2 or more pads per hour for 2 hours in a row).  You see pus in the wound area.  MAKE SURE YOU:   Understand these instructions.  Will watch your condition.  Will get help right away if you are not doing well or get worse. Document Released: 07/25/2004 Document Revised: 04/27/2014 Document Reviewed: 11/12/2009 [facility] Patient Information 2015 [place], [place]. This information is not intended to replace advice given to you by your health care provider. Make sure you discuss any questions you have with your health care provider.   General Anesthesia, Adult, Care After This sheet gives you information about how to care for yourself after your procedure. Your health  care provider may also give you more specific instructions. If you have problems or questions, contact your health care provider. What can I expect after the procedure? After the procedure, the following side effects are common:  Pain or discomfort at the IV site.  Nausea.  Vomiting.  Sore throat.  Trouble concentrating.  Feeling cold or chills.  Feeling weak or tired.  Sleepiness and fatigue.  Soreness and body aches. These side effects can  affect parts of the body that were not involved in surgery. Follow these instructions at home: For the time period you were told by your health care provider:  Rest.  Do not participate in activities where you could fall or become injured.  Do not drive or use machinery.  Do not drink alcohol.  Do not take sleeping pills or medicines that cause drowsiness.  Do not make important decisions or sign legal documents.  Do not take care of children on your own.   Eating and drinking  Follow any instructions from your health care provider about eating or drinking restrictions.  When you feel hungry, start by eating small amounts of foods that are soft and easy to digest (bland), such as toast. Gradually return to your regular diet.  Drink enough fluid to keep your urine pale yellow.  If you vomit, rehydrate by drinking water, juice, or clear broth. General instructions  If you have sleep apnea, surgery and certain medicines can increase your risk for breathing problems. Follow instructions from your health care provider about wearing your sleep device: ? Anytime you are sleeping, including during daytime naps. ? While taking prescription pain medicines, sleeping medicines, or medicines that make you drowsy.  Have a responsible adult stay with you for the time you are told. It is important to have someone help care for you until you are awake and alert.  Return to your normal activities as told by your health care provider. Ask your health care provider what activities are safe for you.  Take over-the-counter and prescription medicines only as told by your health care provider.  If you smoke, do not smoke without supervision.  Keep all follow-up visits as told by your health care provider. This is important. Contact a health care provider if:  You have nausea or vomiting that does not get better with medicine.  You cannot eat or drink without vomiting.  You have pain that does  not get better with medicine.  You are unable to pass urine.  You develop a skin rash.  You have a fever.  You have redness around your IV site that gets worse. Get help right away if:  You have difficulty breathing.  You have chest pain.  You have blood in your urine or stool, or you vomit blood. Summary  After the procedure, it is common to have a sore throat or nausea. It is also common to feel tired.  Have a responsible adult stay with you for the time you are told. It is important to have someone help care for you until you are awake and alert.  When you feel hungry, start by eating small amounts of foods that are soft and easy to digest (bland), such as toast. Gradually return to your regular diet.  Drink enough fluid to keep your urine pale yellow.  Return to your normal activities as told by your health care provider. Ask your health care provider what activities are safe for you. This information is not intended to replace advice given  to you by your health care provider. Make sure you discuss any questions you have with your health care provider. Document Revised: 08/26/2020 Document Reviewed: 03/25/2020 Elsevier Patient Education  2021 [employer].

## 2021-05-24 NOTE — Transfer of Care (Signed)
Immediate Anesthesia Transfer of Care Note  Patient: Sheila Carlson  Procedure(s) Performed: DILATATION AND CURETTAGE (N/A ) INTRAUTERINE DEVICE (IUD) INSERTION;MIRENA (N/A )  Patient Location: PACU  Anesthesia Type:General  Level of Consciousness: awake and alert   Airway & Oxygen Therapy: Patient Spontanous Breathing and Patient connected to face mask oxygen  Post-op Assessment: Report given to RN and Post -op Vital signs reviewed and stable  Post vital signs: Reviewed and stable  Last Vitals:  Vitals Value Taken Time  BP 138/78 05/24/21 1033  Temp    Pulse 80 05/24/21 1034  Resp 20 05/24/21 1034  SpO2 100 % 05/24/21 1034  Vitals shown include unvalidated device data.  Last Pain:  Vitals:   05/24/21 0810  TempSrc: Oral         Complications: No complications documented.

## 2021-05-24 NOTE — Anesthesia Postprocedure Evaluation (Signed)
Anesthesia Post Note  Patient: Sheila Carlson  Procedure(s) Performed: DILATATION AND CURETTAGE (N/A ) INTRAUTERINE DEVICE (IUD) INSERTION;MIRENA (N/A )     Patient location during evaluation: PACU Anesthesia Type: General Level of consciousness: awake and alert Pain management: pain level controlled Vital Signs Assessment: post-procedure vital signs reviewed and stable Respiratory status: spontaneous breathing, nonlabored ventilation, respiratory function stable and patient connected to nasal cannula oxygen Cardiovascular status: blood pressure returned to baseline and stable Postop Assessment: no apparent nausea or vomiting Anesthetic complications: no   No complications documented.  Last Vitals:  Vitals:   05/24/21 1100 05/24/21 1115  BP: (!) 141/80 (!) 150/84  Pulse: 71 72  Resp: 16 18  Temp:  36.4 C  SpO2: 98% 92%    Last Pain:  Vitals:   05/24/21 1115  TempSrc:   PainSc: 4                  Candra R Terease Marcotte

## 2021-05-24 NOTE — Interval H&P Note (Signed)
History and Physical Interval Note:  05/24/2021 9:48 AM  Sheila Carlson  has presented today for surgery, with the diagnosis of ENDOMETRIAL CANCER.  The various methods of treatment have been discussed with the patient and family. After consideration of risks, benefits and other options for treatment, the patient has consented to  Procedure(s): DILATATION AND CURETTAGE (N/A) INTRAUTERINE DEVICE (IUD) INSERTION;MIRENA (N/A) as a surgical intervention.  The patient's history has been reviewed, patient examined, no change in status, stable for surgery.  I have reviewed the patient's chart and labs.  Questions were answered to the patient's satisfaction.     Thereasa Solo

## 2021-05-24 NOTE — Op Note (Signed)
OPERATIVE NOTE  PATIENT: Sheila Carlson DATE: 05/24/21  Preop Diagnosis:   Endometrial cancer  Postoperative Diagnosis: same  Surgery: D&C (dilation and curettage) with mirena IUD  Surgeons:  Donaciano Eva, MD Assistant: none  Anesthesia: General   Estimated blood loss: minimal  IVF:  260ml   Urine output: 50 ml   Complications: None   Pathology: endometrial curetteings  Operative findings: uterus sounded to 10cm. Normal appearing cervix. Uterus anteverted.  Procedure: The patient was identified in the preoperative holding area. Informed consent was signed on the chart. Patient was seen history was reviewed and exam was performed.   The patient was then taken to the operating room and placed in the supine position with SCD hose on. General anesthesia was then induced without difficulty. She was then placed in the dorsolithotomy position. The perineum was prepped with Betadine. The vagina was prepped with Betadine. The patient was then draped after the prep was dried. A red rubber catheter to empty the bladder was performed under aseptic conditions.  Timeout was performed the patient, procedure, antibiotic, allergy, and length of procedure.   The weighted speculum was placed in the posterior vagina. The single tooth tenaculum was placed on the anterior lip of the cervix. The uterine sound was placed in the cervix and advanced to the fundus. The cervix was successively dilated using pratts dilators. A sharp curette was advanced to the fundus and a comprehensive curette of the endometrial cavity took place until a gritty feel was appreciated. The specimen was collected on a telfa and sent for permanent pathology.  The mirena IUD was then deployed at the uterine fundus. Strings were cut to 3 inches.   The tenaculum was removed and hemostasis was observed.   The vagina was irrigated.  All instrument, suture, laparotomy, Ray-Tec, and needle counts were correct x2.  The patient tolerated the procedure well and was taken recovery room in stable condition. This is Everitt Amber dictating.  Thereasa Solo, MD

## 2021-05-24 NOTE — Anesthesia Procedure Notes (Addendum)
Procedure Name: LMA Insertion Date/Time: 05/24/2021 10:04 AM Performed by: Sharlette Dense, CRNA Patient Re-evaluated:Patient Re-evaluated prior to induction Oxygen Delivery Method: Circle system utilized Preoxygenation: Pre-oxygenation with 100% oxygen Induction Type: IV induction Ventilation: Mask ventilation without difficulty LMA: LMA inserted LMA Size: 4.0 Number of attempts: 1 Placement Confirmation: positive ETCO2 and breath sounds checked- equal and bilateral Tube secured with: Tape Dental Injury: Teeth and Oropharynx as per pre-operative assessment

## 2021-05-25 ENCOUNTER — Other Ambulatory Visit (HOSPITAL_BASED_OUTPATIENT_CLINIC_OR_DEPARTMENT_OTHER): Payer: Self-pay | Admitting: Family

## 2021-05-25 ENCOUNTER — Encounter (HOSPITAL_COMMUNITY): Payer: Self-pay | Admitting: Gynecologic Oncology

## 2021-05-25 ENCOUNTER — Telehealth: Payer: Self-pay

## 2021-05-25 DIAGNOSIS — Z1231 Encounter for screening mammogram for malignant neoplasm of breast: Secondary | ICD-10-CM

## 2021-05-25 NOTE — Telephone Encounter (Signed)
Spoke with Sheila Carlson this morning. She states she is eating, drinking and urinating well. She has not had a BM. Not passing gas. She took Miralax this am.  Told her she can take a capful bid until good BM.  Encouraged  her to drink plenty of water. She denies fever or chills.. Her pain is controlled with Tylenol 1000 mg every 6 hrs prn. She is experiencing a cramping pain which is a 1/10.   Instructed to call office with any fever, chills, uncontrolled pain or any other questions or concerns. Patient verbalizes understanding.  Pt aware of post op appointments as well as the office number [phone] and after hours number [phone] to call if she has any questions or concerns

## 2021-05-27 ENCOUNTER — Telehealth: Payer: Self-pay

## 2021-05-27 NOTE — Telephone Encounter (Signed)
Told Sheila Carlson that the surgical pathology showed the endometrial cancer  Which Dr. Denman George knew about.  The IUD should treat this per Melissa Cross,NP. Pt verbalized understanding.

## 2021-05-30 ENCOUNTER — Other Ambulatory Visit: Payer: Self-pay

## 2021-05-30 ENCOUNTER — Ambulatory Visit (HOSPITAL_BASED_OUTPATIENT_CLINIC_OR_DEPARTMENT_OTHER)
Admission: RE | Admit: 2021-05-30 | Discharge: 2021-05-30 | Disposition: A | Discharge status: Home / Self Care | Payer: 59 | Source: Ambulatory Visit | Attending: Family | Admitting: Family

## 2021-05-30 ENCOUNTER — Encounter (HOSPITAL_BASED_OUTPATIENT_CLINIC_OR_DEPARTMENT_OTHER): Payer: Self-pay

## 2021-05-30 DIAGNOSIS — Z1231 Encounter for screening mammogram for malignant neoplasm of breast: Secondary | ICD-10-CM | POA: Insufficient documentation

## 2021-05-31 ENCOUNTER — Telehealth: Payer: Self-pay

## 2021-05-31 NOTE — Telephone Encounter (Signed)
Returned Sheila Carlson voicemail re: wanting to walk/exercise. She thought she could not exercise after her procedure for 4 weeks. Clarified with her that she needed to avoid strenuous exercise 48 hours after her surgery. She can resume exercising and walking but to listen to her body and if something is uncomfortable to stop. Patient verbalized understanding and will call with any questions.

## 2021-06-03 ENCOUNTER — Ambulatory Visit (HOSPITAL_BASED_OUTPATIENT_CLINIC_OR_DEPARTMENT_OTHER)
Admission: RE | Admit: 2021-06-03 | Discharge: 2021-06-03 | Disposition: A | Discharge status: Home / Self Care | Payer: 59 | Source: Ambulatory Visit | Attending: Family | Admitting: Family

## 2021-06-03 ENCOUNTER — Other Ambulatory Visit: Payer: Self-pay

## 2021-06-03 ENCOUNTER — Ambulatory Visit (INDEPENDENT_AMBULATORY_CARE_PROVIDER_SITE_OTHER): Payer: 59 | Admitting: Family

## 2021-06-03 VITALS — BP 149/90 | HR 70 | Temp 98.5°F | Resp 16 | Wt 312.0 lb

## 2021-06-03 DIAGNOSIS — M25562 Pain in left knee: Secondary | ICD-10-CM | POA: Diagnosis present

## 2021-06-03 MED ORDER — MELOXICAM 7.5 MG PO TABS: 7.5000 mg | ORAL_TABLET | Freq: Every day | ORAL | 0 refills | Status: DC

## 2021-06-03 NOTE — Patient Instructions (Signed)
Please complete lab work prior to leaving. Complete x-ray on the first floor. Start meloxicam. Call if pain worsens or if not improved in 2 weeks.

## 2021-06-03 NOTE — Progress Notes (Signed)
Subjective:   By signing my name below, I, Sheila Carlson, attest that this documentation has been prepared under the direction and in the presence of Sheila Alar NP. 06/03/2021    Patient ID: Sheila Carlson, female    DOB: 07/10/1961, 60 y.o.   MRN: [id number]  No chief complaint on file.   HPI Patient is in today for a office visit. She complains of left knee pain for the past month. She uses ice to manage the pain and finds mild relief. She denies redness on the knee and fever. Her son recently had gout.   Health Maintenance Due  Topic Date Due   Pneumococcal Vaccine 71-21 Years old (1 - PCV) Never done   FOOT EXAM  Never done   COLONOSCOPY (Pts 45-67yrs Insurance coverage will need to be confirmed)  Never done   COVID-19 Vaccine (3 - Mixed Product risk series) 04/26/2020   OPHTHALMOLOGY EXAM  06/22/2020    Past Medical History:  Diagnosis Date   Anemia    Cancer ([place])    endometrial   COVID-19 04/2020   History of low potassium    Hyperlipidemia 04/16/2020   Hypertension    Pre-diabetes     Past Surgical History:  Procedure Laterality Date   CESAREAN SECTION     DILATION AND CURETTAGE OF UTERUS N/A 05/24/2021   Procedure: DILATATION AND CURETTAGE;  Surgeon: Everitt Amber, MD;  Location: [facility];  Service: Gynecology;  Laterality: N/A;   INTRAUTERINE DEVICE (IUD) INSERTION N/A 05/24/2021   Procedure: INTRAUTERINE DEVICE (IUD) INSERTION;MIRENA;  Surgeon: Everitt Amber, MD;  Location: [facility];  Service: Gynecology;  Laterality: N/A;    Family History  Problem Relation Age of Onset   Hypertension Mother    Diabetes Mother    High Cholesterol Mother    Alcoholism Mother    Obesity Mother    Alcohol abuse Father    Hypertension Father    Asthma Sister    Colitis Sister        pt unsure    Social History   Socioeconomic History   Marital status: Married    Spouse name: Sheila Carlson   Number of children: 2   Years of education: Not on file   Highest education  level: Not on file  Occupational History   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  Tobacco Use   Smoking status: Never   Smokeless tobacco: Never  Vaping Use   Vaping Use: Never used  Substance and Sexual Activity   Alcohol use: Not Currently   Drug use: No   Sexual activity: Not Currently    Partners: Male  Other Topics Concern   Not on file  Social History Narrative   Patient has 2 biological sons   2 adopted children (son, daughter)   Hotel manager   Married (second married)   Completed bachelors in early childhood   No pets   Enjoys Corporate investment banker booking, decorating, shopping   Social Determinants of Health   Financial Resource Strain: Not on file  Food Insecurity: Not on file  Transportation Needs: Not on file  Physical Activity: Not on file  Stress: Not on file  Social Connections: Not on file  Intimate Partner Violence: Not on file    Outpatient Medications Prior to Visit  Medication Sig Dispense Refill   amLODipine (NORVASC) 10 MG tablet TAKE 1 TABLET(10 MG) BY MOUTH DAILY 90 tablet 0   atorvastatin (LIPITOR) 20 MG tablet Take 1 tablet (20 mg total) by mouth daily. [place]  tablet 0   hydrochlorothiazide (HYDRODIURIL) 25 MG tablet Take 1 tablet (25 mg total) by mouth daily. 90 tablet 1   ibuprofen (ADVIL) 600 MG tablet Take 1 tablet (600 mg total) by mouth every 6 (six) hours as needed. Take 1 tab 30 minutes prior to procedure (Patient not taking: No sig reported) 10 tablet 0   lisinopril (ZESTRIL) 40 MG tablet Take 1 tablet (40 mg total) by mouth daily. 90 tablet 0   metoprolol succinate (TOPROL-XL) 50 MG 24 hr tablet TAKE 1 TABLET(50 MG) BY MOUTH DAILY WITH OR IMMEDIATELY FOLLOWING A MEAL (Patient taking differently: Take 50 mg by mouth daily. TAKE 1 TABLET(50 MG) BY MOUTH DAILY WITH OR IMMEDIATELY FOLLOWING A MEAL) 90 tablet 1   oxyCODONE-acetaminophen (PERCOCET/ROXICET) 5-325 MG tablet Take 1 tablet by mouth every 4 (four) hours as needed for severe pain. Take 1 tab 30 minutes  prior to procedure (Patient not taking: No sig reported) 6 tablet 0   potassium chloride SA (KLOR-CON) 20 MEQ tablet Take 1 tablet (20 mEq total) by mouth daily. 90 tablet 0   triamcinolone lotion (KENALOG) 0.1 % Apply 1 application topically 3 (three) times daily. (Patient not taking: No sig reported) 120 mL 0   vitamin C (ASCORBIC ACID) 500 MG tablet Take 500 mg by mouth daily.     Vitamin D, Ergocalciferol, (DRISDOL) 1.25 MG (50000 UNIT) CAPS capsule Take 1 capsule (50,000 Units total) by mouth every 7 (seven) days. 4 capsule 0   No facility-administered medications prior to visit.    Allergies  Allergen Reactions   Hydrocodone     Causes hallucinations.  Patient states she can tolerate oxycodone.   Penicillins Swelling    Review of Systems  Constitutional:  Negative for fever.  Musculoskeletal:  Positive for joint pain (Left knee).  Skin:        (-)Redness over knee      Objective:    Physical Exam Constitutional:      General: She is not in acute distress.    Appearance: Normal appearance. She is not ill-appearing.  HENT:     Head: Normocephalic and atraumatic.     Right Ear: External ear normal.     Left Ear: External ear normal.  Eyes:     Extraocular Movements: Extraocular movements intact.     Pupils: Pupils are equal, round, and reactive to light.  Cardiovascular:     Rate and Rhythm: Normal rate and regular rhythm.     Pulses: Normal pulses.     Heart sounds: Normal heart sounds. No murmur heard.   No gallop.  Pulmonary:     Effort: Pulmonary effort is normal. No respiratory distress.     Breath sounds: Normal breath sounds. No wheezing, rhonchi or rales.  Musculoskeletal:     Left knee: No swelling. Tenderness (Below patellar tendon) present.     Instability Tests: Anterior drawer test negative.  Skin:    General: Skin is warm and dry.  Neurological:     Mental Status: She is alert and oriented to person, place, and time.  Psychiatric:        Behavior:  Behavior normal.    LMP 02/14/2016  Wt Readings from Last 3 Encounters:  05/24/21 (!) 310 lb (140.6 kg)  05/17/21 (!) 310 lb (140.6 kg)  05/13/21 (!) 321 lb 8 oz (145.8 kg)       Assessment & Plan:   Problem List Items Addressed This Visit   None    No orders  of the defined types were placed in this encounter.   I, Sheila Alar NP, personally preformed the services described in this documentation.  All medical record entries made by the scribe were at my direction and in my presence.  I have reviewed the chart and discharge instructions (if applicable) and agree that the record reflects my personal performance and is accurate and complete. 06/03/2021   I,Sheila Carlson,acting as a Education administrator for Nance Pear, NP.,have documented all relevant documentation on the behalf of Nance Pear, NP,as directed by  Nance Pear, NP while in the presence of Nance Pear, NP.   Sheila [employer]

## 2021-06-04 LAB — URIC ACID: Uric Acid, Serum: 6.6 mg/dL (ref 2.5–7.0)

## 2021-06-07 ENCOUNTER — Telehealth: Payer: Self-pay | Admitting: Family

## 2021-06-07 DIAGNOSIS — M25562 Pain in left knee: Secondary | ICD-10-CM | POA: Insufficient documentation

## 2021-06-07 MED ORDER — COLCHICINE 0.6 MG PO TABS: ORAL_TABLET | ORAL | 0 refills | Status: DC

## 2021-06-07 NOTE — Assessment & Plan Note (Signed)
X-ray shows degenerative changes.  Uric acid level is obtained and shows high normal level. Recommend trial of colchicine 0.6, two tabs by mouth now and 1 tab in 1 hour.  If no improvement following colchicine, plan referral to orthopedics.

## 2021-06-07 NOTE — Telephone Encounter (Signed)
Please contact pt and let her know that x-ray showed some knee arthritis, and uric acid (gout test) was high normal. If she is still having pain, I would recommend that she take a round of colchicine (2 tabs followed by 1 tab an hour later).  She will likely have a brief period of diarrhea following- so take when she is home for the evening.  Let me know if this helps her pain. Hold atorvastatin the day that she takes the colchicine due to possible drug interaction, then can restart the following day.

## 2021-06-08 NOTE — Telephone Encounter (Signed)
Patient was advised of x-ray and blood test results. She will pick up medication and take tomorrow. She will not take the atorvastatin tomorrow. She will call us back to let us know if this worked for her pain or not.

## 2021-06-13 ENCOUNTER — Other Ambulatory Visit: Payer: Self-pay

## 2021-06-14 LAB — SURGICAL PATHOLOGY

## 2021-06-30 ENCOUNTER — Other Ambulatory Visit: Payer: Self-pay | Admitting: Family

## 2021-07-02 ENCOUNTER — Other Ambulatory Visit: Payer: Self-pay | Admitting: Internal Medicine

## 2021-07-02 ENCOUNTER — Other Ambulatory Visit: Payer: Self-pay | Admitting: Family

## 2021-08-01 ENCOUNTER — Other Ambulatory Visit: Payer: Self-pay | Admitting: Family

## 2021-08-24 ENCOUNTER — Other Ambulatory Visit: Payer: Self-pay

## 2021-08-24 ENCOUNTER — Inpatient Hospital Stay: Payer: 59 | Attending: Gynecologic Oncology | Admitting: Gynecologic Oncology

## 2021-08-24 ENCOUNTER — Encounter: Payer: Self-pay | Admitting: Gynecologic Oncology

## 2021-08-24 VITALS — BP 142/68 | HR 60 | Temp 98.9°F | Resp 16 | Ht 66.0 in | Wt 303.4 lb

## 2021-08-24 DIAGNOSIS — C541 Malignant neoplasm of endometrium: Secondary | ICD-10-CM | POA: Diagnosis present

## 2021-08-24 DIAGNOSIS — Z79890 Hormone replacement therapy (postmenopausal): Secondary | ICD-10-CM | POA: Insufficient documentation

## 2021-08-24 DIAGNOSIS — Z975 Presence of (intrauterine) contraceptive device: Secondary | ICD-10-CM | POA: Diagnosis not present

## 2021-08-24 NOTE — Patient Instructions (Signed)
Dr Denman George took a biopsy of the uterine lining today.  The result should be back by Tuesday of next week.  If it shows that the cancer has gone away, you do not need to have a hysterectomy or return for repeat biopsies UNLESS YOU DEVELOP NEW BLEEDING SYMPTOMS.  If the biopsy shows that the cancer is still there, there will be 2 options:  1/ continue to wait for the IUD to work (it can take up to 41month) with repeated biopsies every 3 months during that time. 2/ proceed with hysterectomy.   [facility]. Her partners and colleagues including Dr TBerline Lopes Dr JDelsa Saleand MJoylene John Nurse Practitioner will be available to continue your care.   [facility] are [facility] RSerita Gritpartner, Dr TBerline Lopes who will repeat the endometrial biopsy (if today's biopsy was positive for cancer).

## 2021-08-24 NOTE — Progress Notes (Signed)
Follow-up Note: Gyn-Onc  Consult was requested by Dr. Nehemiah Settle for the evaluation of Sheila Carlson 60 y.o. female  CC:  Chief Complaint  Patient presents with   Endometrial ca [facility])    Assessment/Plan:  Sheila Carlson  is a 60 y.o.  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  Biopsy performed today to monitor response. Clinically she has resolved symptoms (bleeding) and has lost some weight. She remains reluctant about hysterectomy.  If the biopsy reveals benign endometrium, I will recommend that she return on a as needed basis (should she develop new bleeding episode) or at 5 years for replacement of the IUD. If the biopsy today reveals persistent malignancy she has 2 options. 1/proceed with hysterectomy with sentinel lymph node biopsy. 2/continue medical management with progesterone as complete resolution has been documented up to 18 months after placement of Mirena IUD.  She will be scheduled to see my partner, Dr. Berline Lopes, in 3 months time as I am leaving the practice in October.  This appointment can be canceled if the patient's biopsy returns as benign and she has no further bleeding symptoms.  HPI: Sheila Carlson is a 60 year old P2 who was seen in consultation at the request of Dr Nehemiah Settle for evaluation and treatment of grade 1 endometrial cancer.   Her symptoms began in March 2022, 3 years after menopause, with postmenopausal bleeding.  She saw her primary care provider, Debbrah Alar, NP, for an established patient visit on 03/21/21. Sheila Inda Castle recommended that she have gynecologic evaluation, because the patient did not have an established gynecologist she made a referral to Dr. Ursula Alert.  Dr. Nehemiah Settle saw the patient for new patient consultation on 04/21/2021. Work-up of symptoms included transvaginal ultrasound scan and endometrial biopsy.  Pap had been performed by Debbrah Alar, NP on  05/11/2020 the preceding year which was normal and negative for high-risk HPV. Transvaginal US on 05/02/2021 showed a uterus measuring 8.3 x 5 x 4.7 cm with an exophytic leiomyoma at the upper uterus measuring 4 x 4.4 cm.  The endometrium has a thickness of 2 mm in the lower uterine segment but the complex was obscured in the mid to upper uterine segment by a large anterior wall mass and a small posterior mass.  Which was suspected to be fibroids.. Endometrial sampling with endometrial Pipelle sampling in the office was performed on 05/05/2021 and showed focal endometrioid adenocarcinoma arising in a background of atypical hyperplasia.   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  They range in age between 57 months and 11 years. This profession is the reason why she was reluctant to proceed with surgery (hysterectomy) as she is concerned about how to manage her business during the downtime.   Interval Hx:  After being offered either surgery with hysterectomy or progesterone therapy with a Mirena IUD, the patient elected for a progestin releasing IUD.  She was taken to the operating room on 05/24/2021 for a D&C and placement of Mirena IUD.  Pathology from the [facility] revealed FIGO grade 1 endometrioid endometrial adenocarcinoma which was mismatch repair protein preserved.  Following placement of the IUD she had no further vaginal bleeding.  She had lost 25 pounds after diagnosis of endometrial cancer and her visit with me today.  She still remained  reluctant and somewhat reticent to proceed with hysterectomy due to her busy work schedule running a daycare.  Current Meds:  Outpatient Encounter Medications as of 08/24/2021  Medication Sig   amLODipine (NORVASC) 10 MG tablet TAKE 1 TABLET(10 MG) BY MOUTH DAILY   atorvastatin (LIPITOR) 20 MG tablet TAKE 1 TABLET(20 MG) BY MOUTH DAILY   colchicine 0.6 MG  tablet Take 2 tabs by mouth now and 1 tab in 1 hour.   hydrochlorothiazide (HYDRODIURIL) 25 MG tablet TAKE 1 TABLET(25 MG) BY MOUTH DAILY   ibuprofen (ADVIL) 600 MG tablet Take 1 tablet (600 mg total) by mouth every 6 (six) hours as needed. Take 1 tab 30 minutes prior to procedure   lisinopril (ZESTRIL) 40 MG tablet TAKE 1 TABLET(40 MG) BY MOUTH DAILY   meloxicam (MOBIC) 7.5 MG tablet Take 1 tablet (7.5 mg total) by mouth daily.   oxyCODONE-acetaminophen (PERCOCET/ROXICET) 5-325 MG tablet Take 1 tablet by mouth every 4 (four) hours as needed for severe pain. Take 1 tab 30 minutes prior to procedure   potassium chloride SA (KLOR-CON) 20 MEQ tablet TAKE 1 TABLET BY MOUTH EVERY DAY   vitamin C (ASCORBIC ACID) 500 MG tablet Take 500 mg by mouth daily.   metoprolol succinate (TOPROL-XL) 50 MG 24 hr tablet TAKE 1 TABLET(50 MG) BY MOUTH DAILY WITH OR IMMEDIATELY FOLLOWING A MEAL (Patient not taking: Reported on 08/24/2021)   triamcinolone lotion (KENALOG) 0.1 % APPLY TOPICALLY TO THE AFFECTED AREA THREE TIMES DAILY (Patient not taking: Reported on 08/24/2021)   Vitamin D, Ergocalciferol, (DRISDOL) 1.25 MG (50000 UNIT) CAPS capsule Take 1 capsule (50,000 Units total) by mouth every 7 (seven) days. (Patient not taking: Reported on 08/24/2021)   No facility-administered encounter medications on file as of 08/24/2021.    Allergy:  Allergies  Allergen Reactions   Hydrocodone     Causes hallucinations.  Patient states she can tolerate oxycodone.   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  Tobacco Use   Smoking status: Never   Smokeless tobacco: Never  Vaping Use   Vaping Use: Never used  Substance and Sexual Activity   Alcohol use: Not Currently   Drug use: No    Sexual activity: Not Currently    Partners: Male  Other Topics Concern   Not on file  Social History Narrative   Patient has 2 biological sons   2 adopted children (son, daughter)   Hotel manager   Married (second married)   Completed bachelors in early childhood   No pets   Enjoys Corporate investment banker booking, decorating, shopping   Social Determinants of Health   Financial Resource Strain: Not on file  Food Insecurity: Not on file  Transportation Needs: Not on file  Physical Activity: Not on file  Stress: Not on file  Social Connections: Not on file  Intimate Partner Violence: Not on file    Past Surgical Hx:  Past Surgical History:  Procedure Laterality Date   CESAREAN SECTION     DILATION AND CURETTAGE OF UTERUS N/A 05/24/2021   Procedure: DILATATION AND CURETTAGE;  Surgeon: Everitt Amber, MD;  Location: [facility];  Service: Gynecology;  Laterality: N/A;   INTRAUTERINE DEVICE (IUD) INSERTION N/A 05/24/2021  Procedure: INTRAUTERINE DEVICE (IUD) INSERTION;MIRENA;  Surgeon: Everitt Amber, MD;  Location: [facility];  Service: Gynecology;  Laterality: N/A;    Past Medical Hx:  Past Medical History:  Diagnosis Date   Anemia    Cancer ([place])    endometrial   COVID-19 04/2020   History of low potassium    Hyperlipidemia 04/16/2020   Hypertension    Pre-diabetes     Past Gynecological History:  Cesarean section x 2 Patient's last menstrual period was 02/14/2016.  Family Hx:  Family History  Problem Relation Age of Onset   Hypertension Mother    Diabetes Mother    High Cholesterol Mother    Alcoholism Mother    Obesity Mother    Alcohol abuse Father    Hypertension Father    Asthma Sister    Colitis Sister        pt unsure    Review of Systems:  Constitutional  Feels well,    ENT Normal appearing ears and nares bilaterally Skin/Breast  No rash, sores, jaundice, itching, dryness Cardiovascular  No chest pain, shortness of breath, or edema  Pulmonary  No cough or wheeze.   Gastro Intestinal  No nausea, vomitting, or diarrhoea. No bright red blood per rectum, no abdominal pain, change in bowel movement, or constipation.  Genito Urinary  No frequency, urgency, dysuria, no bleeding Musculo Skeletal  No myalgia, arthralgia, joint swelling or pain  Neurologic  No weakness, numbness, change in gait,  Psychology  No depression, anxiety, insomnia.   Vitals:  Blood pressure (!) 142/68, pulse 60, temperature 98.9 F (37.2 C), temperature source Oral, resp. rate 16, height 5' 6" (1.676 m), weight (!) 303 lb 6.4 oz (137.6 kg), last menstrual period 02/14/2016, SpO2 100 %.  Physical Exam: WD in NAD Neck  Supple NROM, without any enlargements.  Lymph Node Survey No cervical supraclavicular or inguinal adenopathy Cardiovascular  Well perfused peripheries.  Lungs  No increased WOB Skin  No rash/lesions/breakdown  Psychiatry  Alert and oriented to person, place, and time  Abdomen  Normoactive bowel sounds, abdomen soft, non-tender and obese without evidence of hernia. No significant pannus, abdomen "lays out" in supine. Vertical midline incision from c/s's. Back No CVA tenderness Genito Urinary  Vulva/vagina: Normal external female genitalia.   No lesions. No discharge or bleeding.  Bladder/urethra:  No lesions or masses, well supported bladder  Vagina: normal  Cervix: Normal appearing, no lesions. IUD strings seen at os.   Uterus:  Small, mobile, no parametrial involvement or nodularity.  Adnexa: no palpable masses. Rectal  deferred Extremities  No bilateral cyanosis, clubbing or edema. Carries weight in her thighs making vaginal exam challenging.   Procedure Note:  Preop Dx: endometrial cancer Postop Dx: same Procedure: endometrial biopsy Surgeon: Dorann Ou, MD EBL: scant Specimens: endometrium Complications: none Procedure Details: The patient provided verbal consent and a verbal timeout was performed.  Speculum was inserted to the vagina and the  cervix was visualized.  It was grasped with single-tooth tenaculum.  The endometrium was sampled with an endometrial Pipelle with 2 passes.  It was inserted to a depth of 8 cm.  There was a small amount of tissue retrieved with sampling.  This was sent for permanent histopathology.  The tenaculum was removed with minimal bleeding.  Patient tolerated procedure well.    Thereasa Solo, MD  08/24/2021, 4:23 PM

## 2021-08-26 LAB — SURGICAL PATHOLOGY

## 2021-08-31 ENCOUNTER — Other Ambulatory Visit: Payer: Self-pay | Admitting: Family

## 2021-09-01 ENCOUNTER — Other Ambulatory Visit: Payer: Self-pay | Admitting: Family

## 2021-09-01 ENCOUNTER — Telehealth: Payer: Self-pay

## 2021-09-01 MED ORDER — LISINOPRIL 40 MG PO TABS: 40.0000 mg | ORAL_TABLET | Freq: Every day | ORAL | 0 refills | Status: DC

## 2021-09-01 MED ORDER — AMLODIPINE BESYLATE 10 MG PO TABS: 10.0000 mg | ORAL_TABLET | Freq: Every day | ORAL | 0 refills | Status: DC

## 2021-09-01 NOTE — Telephone Encounter (Signed)
Spoke with Ms. Qualley, pathology report from endometrial biopsy has been reviewed. Per Joylene John, NP IUD is doing its job, the biopsy is negative for hyperplasia or malignancy. Patient very happy with this news. Appointment with Dr. Berline Lopes canceled for November. Instructed patient she does not need a hysterectomy or return for repeat biopsies. However if she develops new bleeding symptoms she is to call our office. Patient verbalized understanding and will call with questions or concerns.

## 2021-09-30 ENCOUNTER — Other Ambulatory Visit: Payer: Self-pay | Admitting: Family

## 2021-10-04 ENCOUNTER — Encounter (HOSPITAL_BASED_OUTPATIENT_CLINIC_OR_DEPARTMENT_OTHER): Payer: Self-pay

## 2021-10-04 ENCOUNTER — Other Ambulatory Visit: Payer: Self-pay

## 2021-10-04 ENCOUNTER — Emergency Department (HOSPITAL_BASED_OUTPATIENT_CLINIC_OR_DEPARTMENT_OTHER)
Admission: EM | Admit: 2021-10-04 | Discharge: 2021-10-04 | Disposition: A | Discharge status: Home / Self Care | Payer: 59 | Attending: Emergency Medicine | Admitting: Emergency Medicine

## 2021-10-04 DIAGNOSIS — J101 Influenza due to other identified influenza virus with other respiratory manifestations: Secondary | ICD-10-CM | POA: Insufficient documentation

## 2021-10-04 DIAGNOSIS — E119 Type 2 diabetes mellitus without complications: Secondary | ICD-10-CM | POA: Insufficient documentation

## 2021-10-04 DIAGNOSIS — Z20822 Contact with and (suspected) exposure to covid-19: Secondary | ICD-10-CM | POA: Diagnosis not present

## 2021-10-04 DIAGNOSIS — Z79899 Other long term (current) drug therapy: Secondary | ICD-10-CM | POA: Diagnosis not present

## 2021-10-04 DIAGNOSIS — Z8542 Personal history of malignant neoplasm of other parts of uterus: Secondary | ICD-10-CM | POA: Insufficient documentation

## 2021-10-04 DIAGNOSIS — Z8616 Personal history of COVID-19: Secondary | ICD-10-CM | POA: Insufficient documentation

## 2021-10-04 DIAGNOSIS — I10 Essential (primary) hypertension: Secondary | ICD-10-CM | POA: Diagnosis not present

## 2021-10-04 DIAGNOSIS — R059 Cough, unspecified: Secondary | ICD-10-CM | POA: Diagnosis present

## 2021-10-04 NOTE — ED Provider Notes (Signed)
[facility] Provider Note   CSN: [id number] Arrival date & time: 10/04/21  1938     History Chief Complaint  Patient presents with   Cough    Sheila Carlson is a 60 y.o. female.  The history is provided by the patient.  Cough Cough characteristics:  Non-productive Severity:  Mild Onset quality:  Gradual Duration:  2 days Timing:  Intermittent Progression:  Unchanged Chronicity:  New Context: upper respiratory infection   Relieved by:  Nothing Worsened by:  Nothing Ineffective treatments:  None tried Associated symptoms: sinus congestion and sore throat   Associated symptoms: no chest pain, no chills, no ear fullness, no ear pain, no eye discharge, no fever, no rash and no shortness of breath       Past Medical History:  Diagnosis Date   Anemia    Cancer ([place])    endometrial   COVID-19 04/2020   History of low potassium    Hyperlipidemia 04/16/2020   Hypertension    Pre-diabetes     Patient Active Problem List   Diagnosis Date Noted   Acute pain of left knee 06/07/2021   Endometrial cancer ([place]) 05/24/2021   Diabetes mellitus type 2 in obese ([place]) 04/29/2020   Hyperlipidemia associated with type 2 diabetes mellitus ([place]) 04/16/2020   OBESITY NOS 06/25/2007   Hypertension associated with type 2 diabetes mellitus ([place]) 06/25/2007   GERD 06/25/2007   HEMATOCHEZIA 06/25/2007   ECZEMA 06/25/2007   ANKLE EDEMA 06/25/2007    Past Surgical History:  Procedure Laterality Date   CESAREAN SECTION     DILATION AND CURETTAGE OF UTERUS N/A 05/24/2021   Procedure: DILATATION AND CURETTAGE;  Surgeon: Everitt Amber, MD;  Location: [facility];  Service: Gynecology;  Laterality: N/A;   INTRAUTERINE DEVICE (IUD) INSERTION N/A 05/24/2021   Procedure: INTRAUTERINE DEVICE (IUD) INSERTION;MIRENA;  Surgeon: Everitt Amber, MD;  Location: [facility];  Service: Gynecology;  Laterality: N/A;     OB History     Gravida  4   Para  2   Term  2   Preterm       AB  2   Living         SAB      IAB  2   Ectopic      Multiple      Live Births  2           Family History  Problem Relation Age of Onset   Hypertension Mother    Diabetes Mother    High Cholesterol Mother    Alcoholism Mother    Obesity Mother    Alcohol abuse Father    Hypertension Father    Asthma Sister    Colitis Sister        pt unsure    Social History   Tobacco Use   Smoking status: Never   Smokeless tobacco: Never  Vaping Use   Vaping Use: Never used  Substance Use Topics   Alcohol use: Not Currently   Drug use: No    Home Medications Prior to Admission medications   Medication Sig Start Date End Date Taking? Authorizing Provider  amLODipine (NORVASC) 10 MG tablet Take 1 tablet (10 mg total) by mouth daily. 09/01/21   Debbrah Alar, NP  lisinopril (ZESTRIL) 40 MG tablet Take 1 tablet (40 mg total) by mouth daily. 09/01/21   Debbrah Alar, NP  atorvastatin (LIPITOR) 20 MG tablet TAKE 1 TABLET(20 MG) BY MOUTH DAILY 09/30/21  Debbrah Alar, NP  colchicine 0.6 MG tablet Take 2 tabs by mouth now and 1 tab in 1 hour. 06/07/21   Debbrah Alar, NP  hydrochlorothiazide (HYDRODIURIL) 25 MG tablet TAKE 1 TABLET(25 MG) BY MOUTH DAILY 06/30/21   Debbrah Alar, NP  ibuprofen (ADVIL) 600 MG tablet Take 1 tablet (600 mg total) by mouth every 6 (six) hours as needed. Take 1 tab 30 minutes prior to procedure 04/21/21   Truett Mainland, DO  meloxicam (MOBIC) 7.5 MG tablet Take 1 tablet (7.5 mg total) by mouth daily. 06/03/21   Debbrah Alar, NP  metoprolol succinate (TOPROL-XL) 50 MG 24 hr tablet TAKE 1 TABLET(50 MG) BY MOUTH DAILY WITH OR IMMEDIATELY FOLLOWING A MEAL Patient not taking: Reported on 08/24/2021 03/21/21   Debbrah Alar, NP  oxyCODONE-acetaminophen (PERCOCET/ROXICET) 5-325 MG tablet Take 1 tablet by mouth every 4 (four) hours as needed for severe pain. Take 1 tab 30 minutes prior to procedure 04/21/21   Truett Mainland, DO  potassium chloride SA (KLOR-CON) 20 MEQ tablet TAKE 1 TABLET BY MOUTH EVERY DAY 09/30/21   Debbrah Alar, NP  triamcinolone lotion (KENALOG) 0.1 % APPLY TOPICALLY TO THE AFFECTED AREA THREE TIMES DAILY 08/31/21   Debbrah Alar, NP  vitamin C (ASCORBIC ACID) 500 MG tablet Take 500 mg by mouth daily.    [provider]  Vitamin D, Ergocalciferol, (DRISDOL) 1.25 MG (50000 UNIT) CAPS capsule Take 1 capsule (50,000 Units total) by mouth every 7 (seven) days. Patient not taking: Reported on 08/24/2021 07/01/20   [name] A, DO    Allergies    Hydrocodone and Penicillins  Review of Systems   Review of Systems  Constitutional:  Negative for chills and fever.  HENT:  Positive for sore throat. Negative for ear pain.   Eyes:  Negative for discharge.  Respiratory:  Positive for cough. Negative for shortness of breath.   Cardiovascular:  Negative for chest pain.  Gastrointestinal:  Negative for vomiting.  Genitourinary:  Negative for difficulty urinating.  Musculoskeletal:  Negative for neck stiffness.  Skin:  Negative for rash.  Neurological:  Negative for facial asymmetry.  Psychiatric/Behavioral:  Negative for agitation.   All other systems reviewed and are negative.  Physical Exam Updated Vital Signs BP (!) 145/80 (BP Location: Left Arm)   Pulse 64   Temp 99.2 F (37.3 C) (Oral)   Resp 18   Ht 5\' 6"  (1.676 m)   Wt (!) 138.3 kg   LMP 02/14/2016   SpO2 96%   BMI 49.23 kg/m   Physical Exam Vitals and nursing note reviewed.  Constitutional:      Appearance: Normal appearance.  HENT:     Head: Normocephalic and atraumatic.     Nose: Nose normal.  Eyes:     Conjunctiva/sclera: Conjunctivae normal.     Pupils: Pupils are equal, round, and reactive to light.  Cardiovascular:     Rate and Rhythm: Normal rate and regular rhythm.     Pulses: Normal pulses.     Heart sounds: Normal heart sounds.  Pulmonary:     Effort: Pulmonary effort is normal.      Breath sounds: Normal breath sounds.  Abdominal:     General: Abdomen is flat. Bowel sounds are normal.     Palpations: Abdomen is soft.     Tenderness: There is no abdominal tenderness. There is no guarding.  Musculoskeletal:        General: Normal range of motion.     Cervical  back: Normal range of motion and neck supple.  Skin:    General: Skin is warm and dry.     Capillary Refill: Capillary refill takes less than 2 seconds.  Neurological:     General: No focal deficit present.     Mental Status: She is alert and oriented to person, place, and time.     Deep Tendon Reflexes: Reflexes normal.  Psychiatric:        Mood and Affect: Mood normal.        Behavior: Behavior normal.    ED Results / Procedures / Treatments   Labs (all labs ordered are listed, but only abnormal results are displayed) Labs Reviewed  RESP PANEL BY RT-PCR (FLU A&B, COVID) ARPGX2    EKG None  Radiology No results found.  Procedures Procedures   Medications Ordered in ED Medications - No data to display  ED Course  I have reviewed the triage vital signs and the nursing notes.  Pertinent labs & imaging results that were available during my care of the patient were reviewed by me and considered in my medical decision making (see chart for details).   Viral illness, lungs clear, exam is benign and reassuring, covid and flu sent.  Note given for work    Sheila Carlson was evaluated in Emergency Department on 10/04/2021 for the symptoms described in the history of present illness. She was evaluated in the context of the global COVID-19 pandemic, which necessitated consideration that the patient might be at risk for infection with the SARS-CoV-2 virus that causes COVID-19. Institutional protocols and algorithms that pertain to the evaluation of patients at risk for COVID-19 are in a state of rapid change based on information released by regulatory bodies including the CDC and federal and state  organizations. These policies and algorithms were followed during the patient's care in the ED.  Final Clinical Impression(s) / ED Diagnoses Final diagnoses:  None   Return for intractable cough, coughing up blood, fevers > 100.4 unrelieved by medication, shortness of breath, intractable vomiting, chest pain, shortness of breath, weakness, numbness, changes in speech, facial asymmetry, abdominal pain, passing out, Inability to tolerate liquids or food, cough, altered mental status or any concerns. No signs of systemic illness or infection. The patient is nontoxic-appearing on exam and vital signs are within normal limits.  I have reviewed the triage vital signs and the nursing notes. Pertinent labs & imaging results that were available during my care of the patient were reviewed by me and considered in my medical decision making (see chart for details). After history, exam, and medical workup I feel the patient has been appropriately medically screened and is safe for discharge home. Pertinent diagnoses were discussed with the patient. Patient was given return precautions.  Rx / DC Orders ED Discharge Orders     None        Sahory Nordling, MD 10/04/21 2313

## 2021-10-04 NOTE — ED Triage Notes (Signed)
Pt c/o flu like sx started yesterday-NAD-steady gait

## 2021-10-05 LAB — RESP PANEL BY RT-PCR (FLU A&B, COVID) ARPGX2
Influenza A by PCR: POSITIVE — AB
Influenza B by PCR: NEGATIVE
SARS Coronavirus 2 by RT PCR: NEGATIVE

## 2021-10-06 ENCOUNTER — Other Ambulatory Visit: Payer: Self-pay | Admitting: Family

## 2021-10-30 ENCOUNTER — Other Ambulatory Visit: Payer: Self-pay | Admitting: Family

## 2021-10-30 NOTE — Telephone Encounter (Signed)
Please contact pt to schedule follow up visit.

## 2021-11-04 NOTE — Telephone Encounter (Signed)
Patient is scheduled for monday

## 2021-11-07 ENCOUNTER — Other Ambulatory Visit: Payer: Self-pay

## 2021-11-07 ENCOUNTER — Ambulatory Visit (INDEPENDENT_AMBULATORY_CARE_PROVIDER_SITE_OTHER): Payer: 59 | Admitting: Family

## 2021-11-07 VITALS — BP 146/63 | HR 60 | Temp 98.3°F | Resp 16 | Ht 66.0 in | Wt 308.0 lb

## 2021-11-07 DIAGNOSIS — Z1211 Encounter for screening for malignant neoplasm of colon: Secondary | ICD-10-CM

## 2021-11-07 DIAGNOSIS — Z6841 Body Mass Index (BMI) 40.0 and over, adult: Secondary | ICD-10-CM

## 2021-11-07 DIAGNOSIS — Z23 Encounter for immunization: Secondary | ICD-10-CM

## 2021-11-07 DIAGNOSIS — R739 Hyperglycemia, unspecified: Secondary | ICD-10-CM

## 2021-11-07 NOTE — Assessment & Plan Note (Signed)
S/p medical rx with IUD- followed by Dr. Jim Like. Follow up biopsy was WNL 8/22.

## 2021-11-07 NOTE — Progress Notes (Addendum)
Subjective:   By signing my name below, I, Sheila Carlson, attest that this documentation has been prepared under the direction and in the presence of Sheila Carlson. 11/07/2021     Patient ID: Sheila Carlson, female    DOB: [birth year], 60 y.o.   MRN: [id number]  Chief Complaint  Patient presents with   Weight management    Here to discuss weight management    HPI Patient is in today for a office visit.   Weight- She is trying to lose weight since her cancer diagnosis. She typically drinks 2 eggs with apple sauce and 1 piece of toast for breakfast. She tried drinking 2 shakes for breakfast but found she was still hungry. For lunch she typically eats a wrap with [place], lettuce, and tomatoes. She eats fruit or backed ships as a snack between lunch and dinner. She only drinks around 4 bottles of water. For dinner she typically eats baked chicken, cauliflower rice, and baked potatoes. She typically eats crackers after dinner before bed. She started this diet on May, 2022. She is interested in trying appetite suppressant medication to assist in weight loss.   Wt Readings from Last 3 Encounters:  11/07/21 (!) 308 lb (139.7 kg)  10/04/21 (!) 305 lb (138.3 kg)  08/24/21 (!) 303 lb 6.4 oz (137.6 kg)   Exercise- She participates in exercise by attending a boot camp class and walking daily. She reports since recovering from the flu 2 weeks ago she stopped walking daily.  Cancer- She was diagnosed with endometriosis cancer this past year. She reports recovering at this time.  Immunizations- She has not received the flu vaccine. She has contracted flu 2 weeks ago. She is interested in receiving the flu vaccine. She was informed of the bivalent Covid-19 vaccine and is interested in receiving it at her pharmacy.  Lab work- She is interested in completing lab working during this visit.  Colonoscopy- She is due for a colonoscopy and is interested in scheduling an appointment.    Health  Maintenance Due  Topic Date Due   FOOT EXAM  Never done   COLONOSCOPY (Pts 45-52yr Insurance coverage will need to be confirmed)  Never done   COVID-19 Vaccine (3 - Mixed Product risk series) 04/26/2020   OPHTHALMOLOGY EXAM  06/22/2020   Pneumococcal Vaccine [id number] old (2 - PCV) 07/13/2021   INFLUENZA VACCINE  Never done   HEMOGLOBIN A1C  10/12/2021    Past Medical History:  Diagnosis Date   Anemia    [place]    [place]   COVID-19 04/2020   History of low potassium    Hyperlipidemia 04/16/2020   Hypertension    Pre-diabetes     Past Surgical History:  Procedure Laterality Date   CESAREAN SECTION     DILATION AND CURETTAGE OF UTERUS N/A 05/24/2021   Procedure: DILATATION AND CURETTAGE;  Surgeon: REveritt Amber MD;  Location: [facility];  Service: Gynecology;  Laterality: N/A;   INTRAUTERINE DEVICE (IUD) INSERTION N/A 05/24/2021   Procedure: INTRAUTERINE DEVICE (IUD) INSERTION;MIRENA;  Surgeon: REveritt Amber MD;  Location: [facility];  Service: Gynecology;  Laterality: N/A;    Family History  Problem Relation Age of Onset   Hypertension Mother    Diabetes Mother    High Cholesterol Mother    Alcoholism Mother    Obesity Mother    Alcohol abuse Father    Hypertension Father    Asthma Sister    Colitis Sister  pt unsure    Social History   Socioeconomic History   Marital status: Married    Spouse name: Sheila Carlson   Number of children: 2   Years of education: Not on file   Highest education level: Not on file  Occupational History   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  Tobacco Use   Smoking status: Never   Smokeless tobacco: Never  Vaping Use   Vaping Use: Never used  Substance and Sexual Activity   Alcohol use: Not Currently   Drug use: No   Sexual activity: Not Currently    Partners: Male  Other Topics Concern   Not on file  Social History Narrative   Patient has 2 biological sons   2 adopted children (son, daughter)   Hotel manager   Married  (second married)   Completed bachelors in early childhood   No pets   Enjoys Corporate investment banker booking, decorating, shopping   Social Determinants of Health   Financial Resource Strain: Not on file  Food Insecurity: Not on file  Transportation Needs: Not on file  Physical Activity: Not on file  Stress: Not on file  Social Connections: Not on file  Intimate Partner Violence: Not on file    Outpatient Medications Prior to Visit  Medication Sig Dispense Refill   amLODipine (NORVASC) 10 MG tablet TAKE 1 TABLET(10 MG) BY MOUTH DAILY 30 tablet 0   atorvastatin (LIPITOR) 20 MG tablet TAKE 1 TABLET(20 MG) BY MOUTH DAILY 30 tablet 0   hydrochlorothiazide (HYDRODIURIL) 25 MG tablet TAKE 1 TABLET(25 MG) BY MOUTH DAILY 90 tablet 1   ibuprofen (ADVIL) 600 MG tablet Take 1 tablet (600 mg total) by mouth every 6 (six) hours as needed. Take 1 tab 30 minutes prior to procedure 10 tablet 0   lisinopril (ZESTRIL) 40 MG tablet Take 1 tablet (40 mg total) by mouth daily. 30 tablet 0   metoprolol succinate (TOPROL-XL) 50 MG 24 hr tablet TAKE 1 TABLET(50 MG) BY MOUTH DAILY WITH OR IMMEDIATELY FOLLOWING A MEAL 90 tablet 1   oxyCODONE-acetaminophen (PERCOCET/ROXICET) 5-325 MG tablet Take 1 tablet by mouth every 4 (four) hours as needed for severe pain. Take 1 tab 30 minutes prior to procedure 6 tablet 0   potassium chloride SA (KLOR-CON) 20 MEQ tablet TAKE ONE TABLET BY MOUTH EVERY DAY 30 tablet 0   triamcinolone lotion (KENALOG) 0.1 % APPLY TOPICALLY TO THE AFFECTED AREA THREE TIMES DAILY 120 mL 0   colchicine 0.6 MG tablet Take 2 tabs by mouth now and 1 tab in 1 hour. 3 tablet 0   meloxicam (MOBIC) 7.5 MG tablet Take 1 tablet (7.5 mg total) by mouth daily. 14 tablet 0   vitamin C (ASCORBIC ACID) 500 MG tablet Take 500 mg by mouth daily.     Vitamin D, Ergocalciferol, (DRISDOL) 1.25 MG (50000 UNIT) CAPS capsule Take 1 capsule (50,000 Units total) by mouth every 7 (seven) days. (Patient not taking: Reported on 08/24/2021) 4  capsule 0   No facility-administered medications prior to visit.    Allergies  Allergen Reactions   Hydrocodone     Causes hallucinations.  Patient states she can tolerate oxycodone.   Penicillins Swelling    ROS See HPI    Objective:    Physical Exam Constitutional:      General: She is not in acute distress.    Appearance: Normal appearance. She is not ill-appearing.  HENT:     Head: Normocephalic and atraumatic.     Right Ear: External ear  normal.     Left Ear: External ear normal.  Eyes:     Extraocular Movements: Extraocular movements intact.     Pupils: Pupils are equal, round, and reactive to light.  Cardiovascular:     Rate and Rhythm: Normal rate and regular rhythm.     Heart sounds: Normal heart sounds. No murmur heard.   No gallop.  Pulmonary:     Effort: Pulmonary effort is normal. No respiratory distress.     Breath sounds: Normal breath sounds. No wheezing or rales.  Skin:    General: Skin is warm and dry.  Neurological:     Mental Status: She is alert and oriented to person, place, and time.  Psychiatric:        Behavior: Behavior normal.        Judgment: Judgment normal.    BP (!) 146/63 (BP Location: Right Arm, Patient Position: Sitting, Cuff Size: Large)   Pulse 60   Temp 98.3 F (36.8 C) (Oral)   Resp 16   Ht 5' 6"  (1.676 m)   Wt (!) 308 lb (139.7 kg)   LMP 02/14/2016   SpO2 100%   BMI 49.71 kg/m  Wt Readings from Last 3 Encounters:  11/07/21 (!) 308 lb (139.7 kg)  10/04/21 (!) 305 lb (138.3 kg)  08/24/21 (!) 303 lb 6.4 oz (137.6 kg)       Assessment & Plan:   Problem List Items Addressed This Visit       Unprioritized   OBESITY NOS - Primary    Wt Readings from Last 3 Encounters:  11/07/21 (!) 308 lb (139.7 kg)  10/04/21 (!) 305 lb (138.3 kg)  08/24/21 (!) 303 lb 6.4 oz (137.6 kg)  Encouraged her to eliminate HS snacking. Change lunch side from fruit to vegetable. Add back in daily walking.       Relevant Orders   Amb  Ref to Medical Weight Management   Other Visit Diagnoses     Colon cancer screening       Relevant Orders   Ambulatory referral to Gastroenterology   Hyperglycemia       Relevant Orders   Comp Met (CMET)   Hemoglobin A1c   Needs flu shot          23 minutes were spent on today's visit. The majority of this time was spent counseling the patient.   No orders of the defined types were placed in this encounter.   I, Sheila Carlson, personally preformed the services described in this documentation.  All medical record entries made by the scribe were at my direction and in my presence.  I have reviewed the chart and discharge instructions (if applicable) and agree that the record reflects my personal performance and is accurate and complete. 11/07/2021   I,Sheila Carlson,acting as a Education administrator for Nance Pear, Carlson.,have documented all relevant documentation on the behalf of Nance Pear, Carlson,as directed by  Nance Pear, Carlson while in the presence of Nance Pear, Carlson.   Nance Pear, Carlson

## 2021-11-07 NOTE — Assessment & Plan Note (Signed)
Obtain follow up A1C.  Lab Results  Component Value Date   HGBA1C 6.8 (H) 04/12/2021   HGBA1C 6.6 (H) 08/11/2020   HGBA1C 6.6 (H) 04/29/2020   Lab Results  Component Value Date   MICROALBUR 1.3 04/13/2020   LDLCALC 68 04/12/2021   CREATININE 0.75 05/17/2021   Controlled.

## 2021-11-07 NOTE — Assessment & Plan Note (Addendum)
Wt Readings from Last 3 Encounters:  11/07/21 (!) 308 lb (139.7 kg)  10/04/21 (!) 305 lb (138.3 kg)  08/24/21 (!) 303 lb 6.4 oz (137.6 kg)   Encouraged her to eliminate HS snacking. Change lunch side from fruit to vegetable. Add back in daily walking.

## 2021-11-14 ENCOUNTER — Telehealth: Payer: Self-pay | Admitting: Family

## 2021-11-14 ENCOUNTER — Other Ambulatory Visit: Payer: Self-pay | Admitting: Family

## 2021-11-14 NOTE — Telephone Encounter (Signed)
See mychart.  

## 2021-11-15 ENCOUNTER — Other Ambulatory Visit: Payer: Self-pay

## 2021-11-15 ENCOUNTER — Other Ambulatory Visit (INDEPENDENT_AMBULATORY_CARE_PROVIDER_SITE_OTHER): Payer: 59

## 2021-11-15 DIAGNOSIS — R739 Hyperglycemia, unspecified: Secondary | ICD-10-CM

## 2021-11-15 LAB — COMPREHENSIVE METABOLIC PANEL
ALT: 9 U/L (ref 0–35)
AST: 12 U/L (ref 0–37)
Albumin: 3.8 g/dL (ref 3.5–5.2)
Alkaline Phosphatase: 61 U/L (ref 39–117)
BUN: 17 mg/dL (ref 6–23)
CO2: 31 mEq/L (ref 19–32)
Calcium: 9 mg/dL (ref 8.4–10.5)
Chloride: 103 mEq/L (ref 96–112)
Creatinine, Ser: 0.92 mg/dL (ref 0.40–1.20)
GFR: 66.97 mL/min (ref >60.00)
Glucose, Bld: 121 mg/dL — ABNORMAL HIGH (ref 70–99)
Potassium: 3.7 mEq/L (ref 3.5–5.1)
Sodium: 141 mEq/L (ref 135–145)
Total Bilirubin: 0.5 mg/dL (ref 0.2–1.2)
Total Protein: 6.8 g/dL (ref 6.0–8.3)

## 2021-11-15 LAB — HEMOGLOBIN A1C: Hgb A1c MFr Bld: 6.8 % — ABNORMAL HIGH (ref 4.6–6.5)

## 2021-11-15 NOTE — Addendum Note (Signed)
Addended by: Kelle Darting A on: 11/15/2021 10:36 AM   Modules accepted: Orders

## 2021-11-21 ENCOUNTER — Ambulatory Visit: Payer: 59 | Admitting: Gynecologic Oncology

## 2021-11-29 ENCOUNTER — Other Ambulatory Visit: Payer: Self-pay | Admitting: Family

## 2021-12-27 ENCOUNTER — Encounter: Payer: Self-pay | Admitting: Family

## 2021-12-29 ENCOUNTER — Other Ambulatory Visit: Payer: Self-pay | Admitting: Family

## 2022-01-14 ENCOUNTER — Other Ambulatory Visit: Payer: Self-pay | Admitting: Family

## 2022-01-28 ENCOUNTER — Other Ambulatory Visit: Payer: Self-pay | Admitting: Family

## 2022-02-08 ENCOUNTER — Other Ambulatory Visit: Payer: Self-pay | Admitting: Family

## 2022-02-23 ENCOUNTER — Other Ambulatory Visit: Payer: Self-pay | Admitting: Family

## 2022-04-19 ENCOUNTER — Encounter: Payer: Self-pay | Admitting: Family

## 2022-04-19 ENCOUNTER — Ambulatory Visit (INDEPENDENT_AMBULATORY_CARE_PROVIDER_SITE_OTHER): Payer: Managed Care, Other (non HMO) | Admitting: Family

## 2022-04-19 VITALS — BP 129/62 | HR 58 | Temp 98.2°F | Resp 16 | Ht 66.0 in | Wt 289.0 lb

## 2022-04-19 DIAGNOSIS — E669 Obesity, unspecified: Secondary | ICD-10-CM | POA: Diagnosis not present

## 2022-04-19 DIAGNOSIS — E1169 Type 2 diabetes mellitus with other specified complication: Secondary | ICD-10-CM | POA: Diagnosis not present

## 2022-04-19 DIAGNOSIS — Z0000 Encounter for general adult medical examination without abnormal findings: Secondary | ICD-10-CM | POA: Diagnosis not present

## 2022-04-19 DIAGNOSIS — I10 Essential (primary) hypertension: Secondary | ICD-10-CM | POA: Diagnosis not present

## 2022-04-19 DIAGNOSIS — E785 Hyperlipidemia, unspecified: Secondary | ICD-10-CM

## 2022-04-19 DIAGNOSIS — C541 Malignant neoplasm of endometrium: Secondary | ICD-10-CM

## 2022-04-19 LAB — COMPREHENSIVE METABOLIC PANEL
ALT: 21 U/L (ref 0–35)
AST: 16 U/L (ref 0–37)
Albumin: 3.9 g/dL (ref 3.5–5.2)
Alkaline Phosphatase: 61 U/L (ref 39–117)
BUN: 24 mg/dL — ABNORMAL HIGH (ref 6–23)
CO2: 32 mEq/L (ref 19–32)
Calcium: 9.2 mg/dL (ref 8.4–10.5)
Chloride: 101 mEq/L (ref 96–112)
Creatinine, Ser: 0.79 mg/dL (ref 0.40–1.20)
GFR: 80.17 mL/min (ref >60.00)
Glucose, Bld: 95 mg/dL (ref 70–99)
Potassium: 3.9 mEq/L (ref 3.5–5.1)
Sodium: 140 mEq/L (ref 135–145)
Total Bilirubin: 0.5 mg/dL (ref 0.2–1.2)
Total Protein: 6.9 g/dL (ref 6.0–8.3)

## 2022-04-19 LAB — LIPID PANEL
Cholesterol: 139 mg/dL (ref 0–200)
HDL: 32.3 mg/dL — ABNORMAL LOW (ref >39.00)
LDL Cholesterol: 87 mg/dL (ref 0–99)
NonHDL: 106.58
Total CHOL/HDL Ratio: 4
Triglycerides: 97 mg/dL (ref 0.0–149.0)
VLDL: 19.4 mg/dL (ref 0.0–40.0)

## 2022-04-19 LAB — HEMOGLOBIN A1C: Hgb A1c MFr Bld: 5.9 % (ref 4.6–6.5)

## 2022-04-19 MED ORDER — AMLODIPINE BESYLATE 10 MG PO TABS: ORAL_TABLET | ORAL | 1 refills | Status: DC

## 2022-04-19 MED ORDER — HYDROCHLOROTHIAZIDE 25 MG PO TABS: ORAL_TABLET | ORAL | 1 refills | Status: DC

## 2022-04-19 MED ORDER — ATORVASTATIN CALCIUM 20 MG PO TABS: ORAL_TABLET | ORAL | 1 refills | Status: DC

## 2022-04-19 NOTE — Patient Instructions (Signed)
Please complete lab work prior to leaving. ?Keep up the good work with diet/exercise and weight loss. ? ?

## 2022-04-19 NOTE — Assessment & Plan Note (Signed)
Discussed healthy diet, exercise and weight loss. Refer for colo/follow up mammogram. Pap is up to date. Recommended that she obtain a bivalent covid booster.  ?

## 2022-04-19 NOTE — Progress Notes (Signed)
? ?Subjective:  ? ?By signing my name below, I, Joaquin Music, attest that this documentation has been prepared under the direction and in the presence of Debbrah Alar, NP 04/19/2022   ?  ? ? Patient ID: Sheila Carlson, female    DOB: 1961/10/14, 62 y.o.   MRN: [id number] ? ?No chief complaint on file. ? ? ?HPI ?Patient is in today for a comprehensive physical exam. ? ?Anxiety- She reports that some months ago, she was stressed and lashing out at her family members. She started taking supplements for menopause and her mood is now better. ?Mammogram- Last checked on 05/30/2021. Results were normal. Repeat in 1 year. ?Colonoscopy- Never done ?Dexa- Last checked on 05/18/2020. Patient was considered normal according to the [facility]. Repeat in 2 years. ?Pap smear- Last checked on 05/11/2020. Results were normal. Repeat in 3-5 years. ?Immunizations- She is UTD on flu, pneumonia,tetanus and shingles vaccine. She has 2 Covid-19 vaccines at this time.  ?Diet and Exercise- She has lost some weight. She started a 1200 calorie diet along with some supplements. She plans to start working out soon. ?Wt Readings from Last 3 Encounters:  ?04/19/22 289 lb (131.1 kg)  ?11/07/21 (!) 308 lb (139.7 kg)  ?10/04/21 (!) 305 lb (138.3 kg)  ?  ?Dental and Vision- She is not UTD on vision. ? ?She denies having any unexpected weight change, ear pain, hearing loss and rhinorrhea, visual disturbance, cough, chest pain and leg swelling, nausea, vomiting, diarrhea and blood in stool, or dysuria and frequency, for myalgias and arthralgias, rash, headaches, adenopathy, depression or anxiety at this time. ? ?No recent surgeries. She had an IUD inserted last year for treatment of endometrial cancer.No changes to family medical history. ? ?Past Medical History:  ?Diagnosis Date  ? Anemia   ? Cancer [facility])   ? endometrial  ? COVID-19 04/2020  ? History of low potassium   ? Hyperlipidemia 04/16/2020  ? Hypertension   ? Pre-diabetes    ? ? ?Past Surgical History:  ?Procedure Laterality Date  ? CESAREAN SECTION    ? DILATION AND CURETTAGE OF UTERUS N/A 05/24/2021  ? Procedure: DILATATION AND CURETTAGE;  Surgeon: Everitt Amber, MD;  Location: [facility];  Service: Gynecology;  Laterality: N/A;  ? INTRAUTERINE DEVICE (IUD) INSERTION N/A 05/24/2021  ? Procedure: INTRAUTERINE DEVICE (IUD) INSERTION;MIRENA;  Surgeon: Everitt Amber, MD;  Location: [facility];  Service: Gynecology;  Laterality: N/A;  ? ? ?Family History  ?Problem Relation Age of Onset  ? Hypertension Mother   ? Diabetes Mother   ? High Cholesterol Mother   ? Alcoholism Mother   ? Obesity Mother   ? Alcohol abuse Father   ? Hypertension Father   ? Asthma Sister   ? Colitis Sister   ?     pt unsure  ? ? ?Social History  ? ?Socioeconomic History  ? Marital status: Married  ?  Spouse name: Awanda Mink  ? Number of children: 2  ? Years of education: Not on file  ? Highest education level: Not on file  ?Occupational History  ? 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 Smoking status: Never  ? Smokeless tobacco: Never  ?Vaping Use  ? Vaping Use: Never used  ?Substance and Sexual Activity  ? Alcohol use: Not Currently  ? Drug use: No  ? Sexual activity: Not Currently  ?  Partners: Male  ?Other Topics Concern  ? Not on file  ?Social History Narrative  ? Patient has  2 biological sons  ? 2 adopted children (son, daughter)  ? Daycare worker  ? Married (second married)  ? Completed bachelors in early childhood  ? No pets  ? Enjoys scrap booking, decorating, shopping  ? ?Social Determinants of Health  ? ?Financial Resource Strain: Not on file  ?Food Insecurity: Not on file  ?Transportation Needs: Not on file  ?Physical Activity: Not on file  ?Stress: Not on file  ?Social Connections: Not on file  ?Intimate Partner Violence: Not on file  ? ? ?Outpatient Medications Prior to Visit  ?Medication Sig Dispense Refill  ? lisinopril (ZESTRIL) 40 MG tablet TAKE 1 TABLET(40 MG) BY MOUTH DAILY *DUE FOR APPOINTMENT* 90  tablet 0  ? metoprolol succinate (TOPROL-XL) 50 MG 24 hr tablet TAKE 1 TABLET(50 MG) BY MOUTH DAILY WITH OR IMMEDIATELY FOLLOWING A MEAL*DUE FOR APPOINTMENT* 90 tablet 0  ? potassium chloride SA (KLOR-CON M) 20 MEQ tablet TAKE 1 TABLET BY MOUTH ONCE DAILY 90 tablet 1  ? triamcinolone lotion (KENALOG) 0.1 % APPLY TOPICALLY TO THE AFFECTED AREA THREE TIMES DAILY 120 mL 0  ? amLODipine (NORVASC) 10 MG tablet TAKE 1 TABLET(10 MG) BY MOUTH DAILY 90 tablet 0  ? atorvastatin (LIPITOR) 20 MG tablet TAKE 1 TABLET(20 MG) BY MOUTH DAILY 90 tablet 0  ? hydrochlorothiazide (HYDRODIURIL) 25 MG tablet TAKE 1 TABLET(25 MG) BY MOUTH DAILY 90 tablet 1  ? ibuprofen (ADVIL) 600 MG tablet Take 1 tablet (600 mg total) by mouth every 6 (six) hours as needed. Take 1 tab 30 minutes prior to procedure 10 tablet 0  ? oxyCODONE-acetaminophen (PERCOCET/ROXICET) 5-325 MG tablet Take 1 tablet by mouth every 4 (four) hours as needed for severe pain. Take 1 tab 30 minutes prior to procedure 6 tablet 0  ? ?No facility-administered medications prior to visit.  ? ? ?Allergies  ?Allergen Reactions  ? Hydrocodone   ?  Causes hallucinations.  Patient states she can tolerate oxycodone.  ? Penicillins Swelling  ? ? ?Review of Systems  ?Constitutional:  Negative for fever.  ?HENT:  Negative for ear pain and hearing loss.   ?     (-)nystagmus ?(-)adenopathy  ?Eyes:  Negative for blurred vision.  ?Respiratory:  Negative for cough, shortness of breath and wheezing.   ?Cardiovascular:  Negative for chest pain and leg swelling.  ?Gastrointestinal:  Negative for blood in stool, diarrhea, nausea and vomiting.  ?Genitourinary:  Negative for dysuria and frequency.  ?Musculoskeletal:  Negative for joint pain and myalgias.  ?Skin:  Negative for rash.  ?Neurological:  Negative for headaches.  ?Psychiatric/Behavioral:  Negative for depression. The patient is not nervous/anxious.   ? ?   ?Objective:  ?  ?Physical Exam ?Constitutional:   ?   General: She is not in acute  distress. ?   Appearance: Normal appearance. She is not ill-appearing.  ?HENT:  ?   Head: Normocephalic and atraumatic.  ?   Right Ear: Tympanic membrane, ear canal and external ear normal.  ?   Left Ear: Tympanic membrane, ear canal and external ear normal.  ?Eyes:  ?   Extraocular Movements: Extraocular movements intact.  ?   Pupils: Pupils are equal, round, and reactive to light.  ?Cardiovascular:  ?   Rate and Rhythm: Normal rate and regular rhythm.  ?   Pulses: Normal pulses.  ?   Heart sounds: Normal heart sounds. No murmur heard. ?Pulmonary:  ?   Effort: Pulmonary effort is normal. No respiratory distress.  ?   Breath sounds:  Normal breath sounds. No wheezing or rhonchi.  ?Abdominal:  ?   General: Bowel sounds are normal. There is no distension.  ?   Palpations: Abdomen is soft.  ?   Tenderness: There is no abdominal tenderness. There is no guarding or rebound.  ?Musculoskeletal:  ?   Cervical back: Neck supple.  ?   Comments: 5/5 strength in upper and lower extremities  ?Feet:  ?   Comments: Diabetic Foot Exam - Simple   ?No data filed ?   ?Lymphadenopathy:  ?   Cervical: No cervical adenopathy.  ?Skin: ?   General: Skin is warm and dry.  ?Neurological:  ?   Mental Status: She is alert and oriented to person, place, and time.  ?   Deep Tendon Reflexes:  ?   Reflex Scores: ?     Patellar reflexes are 1+ on the right side and 1+ on the left side. ?Psychiatric:     ?   Behavior: Behavior normal.     ?   Judgment: Judgment normal.  ? ? ?BP 129/62 (BP Location: Right Arm, Patient Position: Sitting, Cuff Size: Large)   Pulse (!) 58   Temp 98.2 ?F (36.8 ?C) (Oral)   Resp 16   Ht '5\' 6"'$  (1.676 m)   Wt 289 lb (131.1 kg)   LMP 02/14/2016   SpO2 100%   BMI 46.65 kg/m?  ?Wt Readings from Last 3 Encounters:  ?04/19/22 289 lb (131.1 kg)  ?11/07/21 (!) 308 lb (139.7 kg)  ?10/04/21 (!) 305 lb (138.3 kg)  ? ? ?Diabetic Foot Exam - Simple   ?Simple Foot Form ?Diabetic Foot exam was performed with the following  findings: Yes 04/19/2022  8:46 AM  ?Visual Inspection ?No deformities, no ulcerations, no other skin breakdown bilaterally: Yes ?Sensation Testing ?Intact to touch and monofilament testing bilaterally: Yes ?Pulse Check ?Poster

## 2022-04-19 NOTE — Assessment & Plan Note (Signed)
I commended her on her weight loss efforts. Obtain follow up A1C.  ?

## 2022-04-19 NOTE — Assessment & Plan Note (Signed)
BP Readings from Last 3 Encounters:  ?04/19/22 129/62  ?11/07/21 (!) 146/63  ?10/04/21 (!) 147/69  ? ?BP is stable/at goal.  Continue lisinopril, toprol-xl, amlodipine, hctz.  ?

## 2022-04-19 NOTE — Assessment & Plan Note (Signed)
Previously saw Dr. Denman George who has left the practice. She is due for follow up. I will try to get her set up with a different provider.   ?

## 2022-04-20 ENCOUNTER — Telehealth: Payer: Self-pay | Admitting: *Deleted

## 2022-04-20 NOTE — Telephone Encounter (Signed)
Attempted to reach the patient to schedule a follow up appt with Dr Lahoma Crocker.  ?

## 2022-04-21 NOTE — Telephone Encounter (Signed)
Called and left message for patient to return a call to schedule apt. ?

## 2022-04-24 NOTE — Telephone Encounter (Signed)
Attempted to reach the patient to schedule a follow up appt with Dr Lahoma Crocker.  ?

## 2022-05-11 ENCOUNTER — Encounter: Payer: Self-pay | Admitting: Obstetrics & Gynecology

## 2022-05-17 ENCOUNTER — Encounter: Payer: Self-pay | Admitting: Obstetrics & Gynecology

## 2022-05-17 ENCOUNTER — Inpatient Hospital Stay: Payer: Commercial Managed Care - HMO | Attending: Obstetrics & Gynecology | Admitting: Obstetrics & Gynecology

## 2022-05-17 ENCOUNTER — Other Ambulatory Visit: Payer: Self-pay

## 2022-05-17 VITALS — BP 159/88 | HR 52 | Temp 98.7°F | Resp 16 | Ht 66.0 in | Wt 287.9 lb

## 2022-05-17 DIAGNOSIS — C541 Malignant neoplasm of endometrium: Secondary | ICD-10-CM | POA: Diagnosis present

## 2022-05-17 DIAGNOSIS — Z79890 Hormone replacement therapy (postmenopausal): Secondary | ICD-10-CM | POA: Insufficient documentation

## 2022-05-17 NOTE — Assessment & Plan Note (Addendum)
Ms. Sheila Carlson  is a 61 y.o.  year old P2 with grade 1 endometrial cancer. Current hormonal therapy with a progestin releasing IUD w/documented regression to normal endometrium on follow-up endometrial sampling   >recommend on a as needed basis or at 5 years for replacement of the IUD.

## 2022-05-17 NOTE — Patient Instructions (Signed)
Return as needed

## 2022-05-17 NOTE — Progress Notes (Signed)
Follow Up Note: Gyn-Onc  Sheila Carlson 61 y.o. female  CC: She presents for a f/u visit   HPI: The oncology history was reviewed.  Interval History: She denies any vaginal bleeding, abdominal/pelvic pain, cough, lethargy or increasing abdominal girth. She has lost 40 lbs on a healthy diet.    Review of Systems  Review of Systems  Constitutional:  Negative for malaise/fatigue and weight loss.  Respiratory:  Negative for shortness of breath and wheezing.   Cardiovascular:  Negative for chest pain and leg swelling.  Gastrointestinal:  Negative for abdominal pain, blood in stool, constipation, nausea and vomiting.  Genitourinary:  Negative for dysuria, frequency, hematuria and urgency.  Musculoskeletal:  Negative for joint pain and myalgias.  Neurological:  Negative for weakness.  Psychiatric/Behavioral:  Negative for depression. The patient does not have insomnia.    Current medications, allergy, social history, past surgical history, past medical history, family history were all reviewed.    Vitals:  BP (!) 159/88 (BP Location: Left Arm, Patient Position: Sitting) Comment: No medication yet today  Pulse (!) 52   Temp 98.7 F (37.1 C) (Oral)   Resp 16   Ht '5\' 6"'$  (1.676 m)   Wt 287 lb 14.4 oz (130.6 kg)   LMP 02/14/2016   SpO2 100%   BMI 46.47 kg/m    Physical Exam Exam conducted with a chaperone present.  Constitutional:      General: She is not in acute distress. Cardiovascular:     Rate and Rhythm: Normal rate and regular rhythm.  Pulmonary:     Effort: Pulmonary effort is normal.     Breath sounds: Normal breath sounds. No wheezing or rhonchi.  Abdominal:     Palpations: Abdomen is soft.     Tenderness: There is no abdominal tenderness. There is no right CVA tenderness or left CVA tenderness.     Hernia: No hernia is present.  Genitourinary:    General: Normal vulva.     Urethra: No urethral lesion.     Vagina: No lesions. No bleeding  Cervix: No lesions, IUD  strings seen  Uterus: Exam limited by the pt's body habitus; NT  Adnexa: Nonpalpable, NT Musculoskeletal:     Cervical back: Neck supple.     Right lower leg: No edema.     Left lower leg: No edema.  Lymphadenopathy:     Upper Body:     Right upper body: No supraclavicular adenopathy.     Left upper body: No supraclavicular adenopathy.     Lower Body: No right inguinal adenopathy. No left inguinal adenopathy.  Skin:    Findings: No rash.  Neurological:     Mental Status: She is oriented to person, place, and time.   Assessment/Plan:  Endometrial cancer [facility]) Ms. Sheila Carlson  is a 61 y.o.  year old P2 with grade 1 endometrial cancer. Current hormonal therapy with a progestin releasing IUD w/documented regression to normal endometrium on follow-up endometrial sampling    >recommend on a as needed basis or at 5 years for replacement of the IUD.     Lahoma Crocker, MD

## 2022-05-24 ENCOUNTER — Inpatient Hospital Stay (HOSPITAL_BASED_OUTPATIENT_CLINIC_OR_DEPARTMENT_OTHER): Admission: RE | Admit: 2022-05-24 | Payer: Commercial Managed Care - HMO | Source: Ambulatory Visit

## 2022-05-24 ENCOUNTER — Encounter: Payer: Self-pay | Admitting: Gastroenterology

## 2022-06-06 ENCOUNTER — Ambulatory Visit (HOSPITAL_BASED_OUTPATIENT_CLINIC_OR_DEPARTMENT_OTHER)
Admission: RE | Admit: 2022-06-06 | Discharge: 2022-06-06 | Disposition: A | Discharge status: Home / Self Care | Payer: Commercial Managed Care - HMO | Source: Ambulatory Visit | Attending: Family | Admitting: Family

## 2022-06-06 ENCOUNTER — Encounter (HOSPITAL_BASED_OUTPATIENT_CLINIC_OR_DEPARTMENT_OTHER): Payer: Self-pay

## 2022-06-06 DIAGNOSIS — Z0000 Encounter for general adult medical examination without abnormal findings: Secondary | ICD-10-CM

## 2022-06-06 DIAGNOSIS — Z1231 Encounter for screening mammogram for malignant neoplasm of breast: Secondary | ICD-10-CM | POA: Diagnosis present

## 2022-06-08 ENCOUNTER — Ambulatory Visit (AMBULATORY_SURGERY_CENTER): Payer: Self-pay

## 2022-06-08 ENCOUNTER — Other Ambulatory Visit: Payer: Self-pay

## 2022-06-08 VITALS — Ht 66.0 in | Wt 293.6 lb

## 2022-06-08 DIAGNOSIS — Z800 Family history of malignant neoplasm of digestive organs: Secondary | ICD-10-CM

## 2022-06-08 MED ORDER — NA SULFATE-K SULFATE-MG SULF 17.5-3.13-1.6 GM/177ML PO SOLN: 1.0000 | Freq: Once | ORAL | 0 refills | Status: AC

## 2022-06-08 NOTE — Progress Notes (Signed)
Denies allergies to eggs or soy products. Denies complication of anesthesia or sedation. Denies use of weight loss medication. Denies use of O2.   Emmi instructions given for colonoscopy.  

## 2022-06-19 ENCOUNTER — Other Ambulatory Visit: Payer: Self-pay | Admitting: Family

## 2022-07-04 ENCOUNTER — Telehealth: Payer: Self-pay | Admitting: Gastroenterology

## 2022-07-04 NOTE — Telephone Encounter (Signed)
Inbound call from patient stating that she needed to reschedule her colonoscopy on 7/13 at 2:00 due to a scheduling conflict.  Patient was rescheduled for 8/15 at 2:00.

## 2022-07-05 ENCOUNTER — Encounter: Payer: Self-pay | Admitting: Internal Medicine

## 2022-07-05 ENCOUNTER — Telehealth (INDEPENDENT_AMBULATORY_CARE_PROVIDER_SITE_OTHER): Payer: Commercial Managed Care - HMO | Admitting: Internal Medicine

## 2022-07-05 ENCOUNTER — Encounter: Payer: Self-pay | Admitting: Family

## 2022-07-05 VITALS — Ht 66.0 in | Wt 289.0 lb

## 2022-07-05 DIAGNOSIS — U071 COVID-19: Secondary | ICD-10-CM

## 2022-07-05 MED ORDER — MOLNUPIRAVIR EUA 200MG CAPSULE: 4.0000 | ORAL_CAPSULE | Freq: Two times a day (BID) | ORAL | 0 refills | Status: AC

## 2022-07-05 NOTE — Progress Notes (Addendum)
Subjective:    Patient ID: Sheila Carlson, female    DOB: 06-Sep-1961, 61 y.o.   MRN: [id number]  DOS:  07/05/2022 Type of visit - description: Virtual   Video Note  Appropriate links were sent to the patient to initiate a video visit however she was unable to obtain video  from her side thus  we   continue with only audio.   I  verified that I am speaking with the correct person using two identifiers.     Location of patient: home  Location of provider: office  Persons participating in the virtual visit: patient, provider   I discussed the limitations of evaluation and management by telemedicine and the availability of in person appointments. The patient expressed understanding and agreed to proceed.   ACUTE Symptoms started 4 days ago: Cough, sneezing, aches and pains, sinus congestion. Had subjective fever, did not check her temperature. Had a headache.  Denies nausea vomiting or diarrhea. + Mild sore throat. No chest pain or difficulty breathing. Overall symptoms are about the same, not getting better or worse. Yesterday she tested positive for COVID.   Review of Systems See above   Past Medical History:  Diagnosis Date   Allergy    Anemia    Anxiety    Cancer ([place])    endometrial   Cataract    COVID-19 04/2020   GERD (gastroesophageal reflux disease)    History of low potassium    Hyperlipidemia 04/16/2020   Hypertension    Pre-diabetes     Past Surgical History:  Procedure Laterality Date   CESAREAN SECTION     DILATION AND CURETTAGE OF UTERUS N/A 05/24/2021   Procedure: DILATATION AND CURETTAGE;  Surgeon: Everitt Amber, MD;  Location: [facility];  Service: Gynecology;  Laterality: N/A;   INTRAUTERINE DEVICE (IUD) INSERTION N/A 05/24/2021   Procedure: INTRAUTERINE DEVICE (IUD) INSERTION;MIRENA;  Surgeon: Everitt Amber, MD;  Location: [facility];  Service: Gynecology;  Laterality: N/A;    Current Outpatient Medications  Medication Instructions   amLODipine  (NORVASC) 10 MG tablet 1 tablet   atorvastatin (LIPITOR) 20 MG tablet TAKE 1 TABLET(20 MG) BY MOUTH DAILY   hydrochlorothiazide (HYDRODIURIL) 25 MG tablet TAKE 1 TABLET(25 MG) BY MOUTH DAILY   lisinopril (ZESTRIL) 40 MG tablet 1 tablet   metoprolol succinate (TOPROL-XL) 50 MG 24 hr tablet TAKE 1 TABLET(50 MG) BY MOUTH DAILY WITH OR IMMEDIATELY FOLLOWING A MEAL*DUE FOR APPOINTMENT*   potassium chloride SA (KLOR-CON M) 20 MEQ tablet TAKE 1 TABLET BY MOUTH ONCE DAILY       Objective:   Physical Exam Ht '5\' 6"'$  (1.676 m)   Wt 289 lb (131.1 kg)   LMP 02/14/2016   BMI 46.65 kg/m  This is a virtual  assessment, we attempted to communicate via video but it did not work. Audio was ok thus we cont the visit.  she was speaking in complete sentences, alert oriented x3, no cough noted.    Assessment     61 year old female, to previews COVID vaccines, history of endometrial cancer, hyperlipidemia, hypertension, prediabetes, presents with   COVID-19: Symptoms started 4 days ago, tested positive yesterday. Option to observation versus treatment discussed, we agreed on the following: Molnupiravir Rest, fluids, OTC's to treat symptoms. Isolate for 6 days Definitely call if symptoms are not improving or if they get worse. Also recommend to get a vaccine in 4 to 6 weeks. She verbalized understanding and has no further questions.  TIME spent 20 minutes  I discussed the assessment and treatment plan with the patient. The patient was provided an opportunity to ask questions and all were answered. The patient agreed with the plan and demonstrated an understanding of the instructions.   The patient was advised to call back or seek an in-person evaluation if the symptoms worsen or if the condition fails to improve as anticipated.

## 2022-07-06 ENCOUNTER — Encounter: Payer: Commercial Managed Care - HMO | Admitting: Gastroenterology

## 2022-07-06 NOTE — Addendum Note (Signed)
Addended by: Kathlene November E on: 07/06/2022 04:24 PM   Modules accepted: Level of Service

## 2022-07-19 ENCOUNTER — Ambulatory Visit: Payer: Commercial Managed Care - HMO | Admitting: Family

## 2022-08-02 ENCOUNTER — Encounter (INDEPENDENT_AMBULATORY_CARE_PROVIDER_SITE_OTHER): Payer: Self-pay

## 2022-08-02 ENCOUNTER — Encounter: Payer: Self-pay | Admitting: Gastroenterology

## 2022-08-03 ENCOUNTER — Telehealth: Payer: Self-pay | Admitting: Gastroenterology

## 2022-08-03 NOTE — Telephone Encounter (Signed)
Inbound call from patient rescheduling upcoming procedure for 08/08/22 at 2:00 pm due to her having to attend a funeral.

## 2022-08-04 ENCOUNTER — Telehealth: Payer: Self-pay | Admitting: Family

## 2022-08-04 NOTE — Telephone Encounter (Signed)
Medication: potassium chloride SA (KLOR-CON M) 20 MEQ tablet   Has the patient contacted their pharmacy? No.  Preferred Pharmacy (with phone number or street name):  [id number] - [place], [place] - [address]  [address] [place], [place] [address]  Phone:  [phone]  Fax:  [phone]   Agent: Please be advised that RX refills may take up to 3 business days. We ask that you follow-up with your pharmacy.

## 2022-08-07 ENCOUNTER — Other Ambulatory Visit: Payer: Self-pay

## 2022-08-07 MED ORDER — POTASSIUM CHLORIDE CRYS ER 20 MEQ PO TBCR: 20.0000 meq | EXTENDED_RELEASE_TABLET | Freq: Every day | ORAL | 1 refills | Status: DC

## 2022-08-07 NOTE — Telephone Encounter (Signed)
Rx sent, patient advised she is due for follow up. She will call back to set up

## 2022-08-08 ENCOUNTER — Encounter: Payer: Commercial Managed Care - HMO | Admitting: Gastroenterology

## 2022-08-10 ENCOUNTER — Telehealth: Payer: Self-pay | Admitting: Gastroenterology

## 2022-08-10 NOTE — Telephone Encounter (Signed)
Patient called stating that she needs new prep instructions for her new date 8/21 at 3:00 and wants them printed and given to the front desk. Stated she is on her way to pick them up. Please advise.

## 2022-08-10 NOTE — Telephone Encounter (Signed)
Spoke with pt- she knows to pick up new instructions on the second floor

## 2022-08-14 ENCOUNTER — Encounter: Payer: Self-pay | Admitting: Gastroenterology

## 2022-08-14 ENCOUNTER — Ambulatory Visit (AMBULATORY_SURGERY_CENTER): Payer: Commercial Managed Care - HMO | Admitting: Gastroenterology

## 2022-08-14 VITALS — BP 144/77 | HR 62 | Temp 98.0°F | Resp 17 | Ht 66.0 in | Wt 293.6 lb

## 2022-08-14 DIAGNOSIS — Z1211 Encounter for screening for malignant neoplasm of colon: Secondary | ICD-10-CM

## 2022-08-14 DIAGNOSIS — D122 Benign neoplasm of ascending colon: Secondary | ICD-10-CM

## 2022-08-14 DIAGNOSIS — Z800 Family history of malignant neoplasm of digestive organs: Secondary | ICD-10-CM | POA: Diagnosis not present

## 2022-08-14 MED ORDER — SODIUM CHLORIDE 0.9 % IV SOLN: 500.0000 mL | INTRAVENOUS | Status: DC

## 2022-08-14 NOTE — Op Note (Signed)
[place] Patient Name: Sheila Carlson Procedure Date: 08/14/2022 3:04 PM MRN: [id number] Endoscopist: Jackquline Denmark , MD Age: 61 Referring MD:  Date of Birth: 11/21/1961 Gender: Female Account #: [id number] Procedure:                Colonoscopy Indications:              Screening in patient at increased risk: Colorectal                            cancer in sister at age 1 Medicines:                Monitored Anesthesia Care Procedure:                Pre-Anesthesia Assessment:                           - Prior to the procedure, a History and Physical                            was performed, and patient medications and                            allergies were reviewed. The patient's tolerance of                            previous anesthesia was also reviewed. The risks                            and benefits of the procedure and the sedation                            options and risks were discussed with the patient.                            All questions were answered, and informed consent                            was obtained. Prior Anticoagulants: The patient has                            taken no previous anticoagulant or antiplatelet                            agents. ASA Grade Assessment: II - A patient with                            mild systemic disease. After reviewing the risks                            and benefits, the patient was deemed in                            satisfactory condition to undergo the procedure.  After obtaining informed consent, the colonoscope                            was passed under direct vision. Throughout the                            procedure, the patient's blood pressure, pulse, and                            oxygen saturations were monitored continuously. The                            Olympus CF-HQ190L [id number]) Colonoscope was                            introduced through the anus and advanced  to the the                            cecum, identified by appendiceal orifice and                            ileocecal valve. The colonoscopy was performed                            without difficulty. The patient tolerated the                            procedure well. The quality of the bowel                            preparation was good. The ileocecal valve,                            appendiceal orifice, and rectum were photographed. Scope In: 3:12:43 PM Scope Out: 3:25:14 PM Scope Withdrawal Time: 0 hours 9 minutes 28 seconds  Total Procedure Duration: 0 hours 12 minutes 31 seconds  Findings:                 A 8 mm polyp was found in the proximal ascending                            colon. The polyp was semi-pedunculated. The polyp                            was removed with a cold snare. Resection and                            retrieval were complete.                           rare (2-3) small-mouthed diverticula were found in                            the ascending colon.  Non-bleeding internal hemorrhoids were found during                            retroflexion. The hemorrhoids were small and Grade                            I (internal hemorrhoids that do not prolapse).                           The exam was otherwise without abnormality on                            direct and retroflexion views. Complications:            No immediate complications. Estimated Blood Loss:     Estimated blood loss: none. Impression:               - One 8 mm polyp in the proximal ascending colon,                            removed with a cold snare. Resected and retrieved.                           - Minimal early right colonic diverticulosis.                           - Non-bleeding internal hemorrhoids.                           - The examination was otherwise normal on direct                            and retroflexion views. Recommendation:           - Patient  has a contact number available for                            emergencies. The signs and symptoms of potential                            delayed complications were discussed with the                            patient. Return to normal activities tomorrow.                            Written discharge instructions were provided to the                            patient.                           - Resume previous diet.                           - Continue present medications.                           -  Await pathology results.                           - Repeat colonoscopy for surveillance based on                            pathology results.                           - The findings and recommendations were discussed                            with the patient's family. Jackquline Denmark, MD 08/14/2022 3:29:16 PM This report has been signed electronically.

## 2022-08-14 NOTE — Progress Notes (Signed)
To pacu, VSS. Report to [email or web address] 

## 2022-08-14 NOTE — Patient Instructions (Signed)
Await pathology results.  Handout on polyps and diverticulosis given.  YOU HAD AN ENDOSCOPIC PROCEDURE TODAY AT THE  ENDOSCOPY CENTER:   Refer to the procedure report that was given to you for any specific questions about what was found during the examination.  If the procedure report does not answer your questions, please call your gastroenterologist to clarify.  If you requested that your care partner not be given the details of your procedure findings, then the procedure report has been included in a sealed envelope for you to review at your convenience later.  YOU SHOULD EXPECT: Some feelings of bloating in the abdomen. Passage of more gas than usual.  Walking can help get rid of the air that was put into your GI tract during the procedure and reduce the bloating. If you had a lower endoscopy (such as a colonoscopy or flexible sigmoidoscopy) you may notice spotting of blood in your stool or on the toilet paper. If you underwent a bowel prep for your procedure, you may not have a normal bowel movement for a few days.  Please Note:  You might notice some irritation and congestion in your nose or some drainage.  This is from the oxygen used during your procedure.  There is no need for concern and it should clear up in a day or so.  SYMPTOMS TO REPORT IMMEDIATELY:  Following lower endoscopy (colonoscopy or flexible sigmoidoscopy):  Excessive amounts of blood in the stool  Significant tenderness or worsening of abdominal pains  Swelling of the abdomen that is new, acute  Fever of 100F or higher  For urgent or emergent issues, a gastroenterologist can be reached at any hour by calling [phone]. Do not use MyChart messaging for urgent concerns.    DIET:  We do recommend a small meal at first, but then you may proceed to your regular diet.  Drink plenty of fluids but you should avoid alcoholic beverages for 24 hours.  ACTIVITY:  You should plan to take it easy for the rest of today  and you should NOT DRIVE or use heavy machinery until tomorrow (because of the sedation medicines used during the test).    FOLLOW UP: Our staff will call the number listed on your records the next business day following your procedure.  We will call around 7:15- 8:00 am to check on you and address any questions or concerns that you may have regarding the information given to you following your procedure. If we do not reach you, we will leave a message.  If you develop any symptoms (ie: fever, flu-like symptoms, shortness of breath, cough etc.) before then, please call [phone].  If you test positive for Covid 19 in the 2 weeks post procedure, please call and report this information to us.    If any biopsies were taken you will be contacted by phone or by letter within the next 1-3 weeks.  Please call us at [phone] if you have not heard about the biopsies in 3 weeks.    SIGNATURES/CONFIDENTIALITY: You and/or your care partner have signed paperwork which will be entered into your electronic medical record.  These signatures attest to the fact that that the information above on your After Visit Summary has been reviewed and is understood.  Full responsibility of the confidentiality of this discharge information lies with you and/or your care-partner.  

## 2022-08-14 NOTE — Progress Notes (Signed)
[facility] History and Physical   Primary Care Physician:  Debbrah Alar, NP   Reason for Procedure:   Family history of colon cancer (sis)  Plan:     colonoscopy     HPI: Sheila Carlson is a 61 y.o. female    Past Medical History:  Diagnosis Date   Allergy    Anemia    Anxiety    Cancer ([place])    endometrial   Cataract    COVID-19 04/2020   GERD (gastroesophageal reflux disease)    History of low potassium    Hyperlipidemia 04/16/2020   Hypertension    Pre-diabetes     Past Surgical History:  Procedure Laterality Date   CESAREAN SECTION     DILATION AND CURETTAGE OF UTERUS N/A 05/24/2021   Procedure: DILATATION AND CURETTAGE;  Surgeon: Everitt Amber, MD;  Location: [facility];  Service: Gynecology;  Laterality: N/A;   INTRAUTERINE DEVICE (IUD) INSERTION N/A 05/24/2021   Procedure: INTRAUTERINE DEVICE (IUD) INSERTION;MIRENA;  Surgeon: Everitt Amber, MD;  Location: [facility];  Service: Gynecology;  Laterality: N/A;    Prior to Admission medications   Medication Sig Start Date End Date Taking? Authorizing Provider  amLODipine (NORVASC) 10 MG tablet 1 tablet   Yes [provider]  atorvastatin (LIPITOR) 20 MG tablet TAKE 1 TABLET(20 MG) BY MOUTH DAILY 06/19/22  Yes Debbrah Alar, NP  hydrochlorothiazide (HYDRODIURIL) 25 MG tablet TAKE 1 TABLET(25 MG) BY MOUTH DAILY 06/19/22  Yes Debbrah Alar, NP  lisinopril (ZESTRIL) 40 MG tablet 1 tablet   Yes [provider]  metoprolol succinate (TOPROL-XL) 50 MG 24 hr tablet TAKE 1 TABLET(50 MG) BY MOUTH DAILY WITH OR IMMEDIATELY FOLLOWING A MEAL*DUE FOR APPOINTMENT* 02/23/22  Yes Debbrah Alar, NP  potassium chloride SA (KLOR-CON M) 20 MEQ tablet Take 1 tablet (20 mEq total) by mouth daily. 08/07/22  Yes Debbrah Alar, NP    Current Outpatient Medications  Medication Sig Dispense Refill   amLODipine (NORVASC) 10 MG tablet 1 tablet     atorvastatin (LIPITOR) 20 MG tablet TAKE 1  TABLET(20 MG) BY MOUTH DAILY 90 tablet 1   hydrochlorothiazide (HYDRODIURIL) 25 MG tablet TAKE 1 TABLET(25 MG) BY MOUTH DAILY 90 tablet 1   lisinopril (ZESTRIL) 40 MG tablet 1 tablet     metoprolol succinate (TOPROL-XL) 50 MG 24 hr tablet TAKE 1 TABLET(50 MG) BY MOUTH DAILY WITH OR IMMEDIATELY FOLLOWING A MEAL*DUE FOR APPOINTMENT* 90 tablet 0   potassium chloride SA (KLOR-CON M) 20 MEQ tablet Take 1 tablet (20 mEq total) by mouth daily. 90 tablet 1   Current Facility-Administered Medications  Medication Dose Route Frequency Provider Last Rate Last Admin   0.9 %  sodium chloride infusion  500 mL Intravenous Continuous Jackquline Denmark, MD        Allergies as of 08/14/2022 - Review Complete 08/14/2022  Allergen Reaction Noted   Hydrocodone  10/24/2019   Penicillins Swelling 07/05/2007    Family History  Problem Relation Age of Onset   Hypertension Mother    Diabetes Mother    High Cholesterol Mother    Alcoholism Mother    Obesity Mother    Alcohol abuse Father    Hypertension Father    Colon cancer Sister    Asthma Sister    Colitis Sister        pt unsure   Breast cancer Neg Hx    Ovarian cancer Neg Hx    Endometrial cancer Neg Hx    Pancreatic cancer Neg  Hx    Prostate cancer Neg Hx    Esophageal cancer Neg Hx    Rectal cancer Neg Hx    Stomach cancer Neg Hx     Social History   Socioeconomic History   Marital status: Married    Spouse name: Awanda Mink   Number of children: 2   Years of education: Not on file   Highest education level: Not on file  Occupational History   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  Tobacco Use   Smoking status: Never   Smokeless tobacco: Never  Vaping Use   Vaping Use: Never used  Substance and Sexual Activity   Alcohol use: Yes    Comment: occasionally   Drug use: No   Sexual activity: Yes    Partners: Male  Other Topics Concern   Not on file  Social History Narrative   Patient has 2 biological sons   2 adopted children (son, daughter)    Hotel manager   Married (second married)   Completed bachelors in early childhood   No pets   Enjoys Corporate investment banker booking, decorating, shopping   Social Determinants of Health   Financial Resource Strain: Not on file  Food Insecurity: Not on file  Transportation Needs: Not on file  Physical Activity: Unknown (04/13/2020)   Exercise Vital Sign    Days of Exercise per Week: 0 days    Minutes of Exercise per Session: Not on file  Stress: Not on file  Social Connections: Not on file  Intimate Partner Violence: Not on file    Review of Systems: Positive for none All other review of systems negative except as mentioned in the HPI.  Physical Exam: Vital signs in last 24 hours: '@VSRANGES'$ @   General:   Alert,  Well-developed, well-nourished, pleasant and cooperative in NAD Lungs:  Clear throughout to auscultation.   Heart:  Regular rate and rhythm; no murmurs, clicks, rubs,  or gallops. Abdomen:  Soft, nontender and nondistended. Normal bowel sounds.   Neuro/Psych:  Alert and cooperative. Normal mood and affect. A and O x 3    No significant changes were identified.  The patient continues to be an appropriate candidate for the planned procedure and anesthesia.   Carmell Austria, MD. [facility] 08/14/2022 2:58 PM@

## 2022-08-15 ENCOUNTER — Telehealth: Payer: Self-pay

## 2022-08-15 NOTE — Telephone Encounter (Signed)
Left message

## 2022-08-20 ENCOUNTER — Encounter: Payer: Self-pay | Admitting: Gastroenterology

## 2022-09-01 ENCOUNTER — Encounter: Payer: Self-pay | Admitting: Family

## 2022-09-01 ENCOUNTER — Ambulatory Visit (INDEPENDENT_AMBULATORY_CARE_PROVIDER_SITE_OTHER): Payer: Commercial Managed Care - HMO | Admitting: Family

## 2022-09-01 VITALS — BP 138/57 | HR 57 | Temp 98.1°F | Resp 16 | Ht 66.0 in | Wt 300.0 lb

## 2022-09-01 DIAGNOSIS — E1169 Type 2 diabetes mellitus with other specified complication: Secondary | ICD-10-CM | POA: Diagnosis not present

## 2022-09-01 DIAGNOSIS — E669 Obesity, unspecified: Secondary | ICD-10-CM

## 2022-09-01 DIAGNOSIS — C541 Malignant neoplasm of endometrium: Secondary | ICD-10-CM | POA: Diagnosis not present

## 2022-09-01 DIAGNOSIS — I10 Essential (primary) hypertension: Secondary | ICD-10-CM | POA: Diagnosis not present

## 2022-09-01 LAB — BASIC METABOLIC PANEL
BUN: 17 mg/dL (ref 6–23)
CO2: 31 mEq/L (ref 19–32)
Calcium: 9.4 mg/dL (ref 8.4–10.5)
Chloride: 102 mEq/L (ref 96–112)
Creatinine, Ser: 0.78 mg/dL (ref 0.40–1.20)
GFR: 81.19 mL/min (ref >60.00)
Glucose, Bld: 109 mg/dL — ABNORMAL HIGH (ref 70–99)
Potassium: 4 mEq/L (ref 3.5–5.1)
Sodium: 140 mEq/L (ref 135–145)

## 2022-09-01 LAB — HEMOGLOBIN A1C: Hgb A1c MFr Bld: 6.6 % — ABNORMAL HIGH (ref 4.6–6.5)

## 2022-09-01 MED ORDER — METOPROLOL SUCCINATE ER 50 MG PO TB24: ORAL_TABLET | ORAL | 1 refills | Status: DC

## 2022-09-01 MED ORDER — AMLODIPINE BESYLATE 10 MG PO TABS: 10.0000 mg | ORAL_TABLET | Freq: Every day | ORAL | 1 refills | Status: DC

## 2022-09-01 MED ORDER — LISINOPRIL 40 MG PO TABS
ORAL_TABLET | ORAL | 1 refills | Status: DC
Start: 2022-09-01 — End: 2022-11-30

## 2022-09-01 MED ORDER — HYDROCHLOROTHIAZIDE 25 MG PO TABS: 25.0000 mg | ORAL_TABLET | Freq: Every day | ORAL | 1 refills | Status: DC

## 2022-09-01 NOTE — Assessment & Plan Note (Signed)
Saw GYN this past spring. They recommended 5 year follow up for IUD exchange.

## 2022-09-01 NOTE — Progress Notes (Signed)
Subjective:   By signing my name below, I, [name], attest that this documentation has been prepared under the direction and in the presence of Sheila Chimera, NP 09/01/2022   Patient ID: Sheila Carlson, female    DOB: 1961-11-07, 61 y.o.   MRN: [id number]  Chief Complaint  Patient presents with   Hypertension    Here for follow up   Obesity    Will like to discuss weight management    HPI Patient is in today for an office visit   Refill: She is requesting a refill of 50 Mg of Metoprolol Succinate, 10 Mg of Amlodipine, 25 Mg of Hydrochlorothiazide, and 40 mg of Lisinopril.  Blood Pressure: As of today's visit, her blood pressure is normal. She is currently taking 10 Mg of Amlodipine, 40 Mg of Lisinopril, 25 Mg of Hydrochlorothiazide and 50 Mg of Metoprolol Succinate.  BP Readings from Last 3 Encounters:  09/01/22 (!) 138/57  08/14/22 (!) 144/77  05/17/22 (!) 159/88   Pulse Readings from Last 3 Encounters:  09/01/22 (!) 57  08/14/22 62  05/17/22 (!) 52   Potassium: She is continuing to take her 20 MEQ of Potassium Chloride SA Lab Results  Component Value Date   K 3.9 04/19/2022   GYN: She reports that she was able to get reestablished with a new gynecologist. She currently takes black cohosh pill supplements to help alleviate her menopause symptoms.  Weight Gain: Her weight is increasing. She was previously on [place] but had to discontinue the plan due to cost. She was previously at [facility] but reports that it did not go well. She is not currently exercising. In terms of diet, she is following a similar diet as before. She eats three meals a day, 3 non-starch vegetables and 1 meat per meal. For fluids, she consumes water.  Wt Readings from Last 3 Encounters:  09/01/22 300 lb (136.1 kg)  08/14/22 293 lb 9.6 oz (133.2 kg)  07/05/22 289 lb (131.1 kg)   Immunizations: She is not interested in receiving an influenza vaccine during today's visit.    Health Maintenance Due  Topic Date Due   COVID-19 Vaccine (3 - Pfizer risk series) 04/26/2020   OPHTHALMOLOGY EXAM  06/22/2020    Past Medical History:  Diagnosis Date   Allergy    Anemia    Anxiety    Cancer ([place])    endometrial   Cataract    COVID-19 04/2020   GERD (gastroesophageal reflux disease)    HEMATOCHEZIA 06/25/2007   Annotation: bright red blood on tissue once or twice a month when constipated.   Qualifier: Diagnosis of  By: Marshell Levan MD, Vicente Serene     History of low potassium    Hyperlipidemia 04/16/2020   Hypertension    Pre-diabetes     Past Surgical History:  Procedure Laterality Date   CESAREAN SECTION     DILATION AND CURETTAGE OF UTERUS N/A 05/24/2021   Procedure: DILATATION AND CURETTAGE;  Surgeon: Everitt Amber, MD;  Location: [facility];  Service: Gynecology;  Laterality: N/A;   INTRAUTERINE DEVICE (IUD) INSERTION N/A 05/24/2021   Procedure: INTRAUTERINE DEVICE (IUD) INSERTION;MIRENA;  Surgeon: Everitt Amber, MD;  Location: [facility];  Service: Gynecology;  Laterality: N/A;    Family History  Problem Relation Age of Onset   Hypertension Mother    Diabetes Mother    High Cholesterol Mother    Alcoholism Mother    Obesity Mother    Alcohol abuse Father  Hypertension Father    Colon cancer Sister    Asthma Sister    Colitis Sister        pt unsure   Breast cancer Neg Hx    Ovarian cancer Neg Hx    Endometrial cancer Neg Hx    Pancreatic cancer Neg Hx    Prostate cancer Neg Hx    Esophageal cancer Neg Hx    Rectal cancer Neg Hx    Stomach cancer Neg Hx     Social History   Socioeconomic History   Marital status: Married    Spouse name: Sheila Carlson   Number of children: 2   Years of education: Not on file   Highest education level: Not on file  Occupational History   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  Tobacco Use   Smoking status: Never   Smokeless tobacco: Never  Vaping Use   Vaping Use: Never used  Substance and Sexual Activity   Alcohol use: Yes     Comment: occasionally   Drug use: No   Sexual activity: Yes    Partners: Male  Other Topics Concern   Not on file  Social History Narrative   Patient has 2 biological sons   2 adopted children (son, daughter)   Hotel manager   Married (second married)   Completed bachelors in early childhood   No pets   Enjoys Corporate investment banker booking, decorating, shopping   Social Determinants of Health   Financial Resource Strain: Not on file  Food Insecurity: Not on file  Transportation Needs: Not on file  Physical Activity: Unknown (04/13/2020)   Exercise Vital Sign    Days of Exercise per Week: 0 days    Minutes of Exercise per Session: Not on file  Stress: Not on file  Social Connections: Not on file  Intimate Partner Violence: Not on file    Outpatient Medications Prior to Visit  Medication Sig Dispense Refill   atorvastatin (LIPITOR) 20 MG tablet TAKE 1 TABLET(20 MG) BY MOUTH DAILY 90 tablet 1   potassium chloride SA (KLOR-CON M) 20 MEQ tablet Take 1 tablet (20 mEq total) by mouth daily. 90 tablet 1   amLODipine (NORVASC) 10 MG tablet 1 tablet     hydrochlorothiazide (HYDRODIURIL) 25 MG tablet TAKE 1 TABLET(25 MG) BY MOUTH DAILY 90 tablet 1   lisinopril (ZESTRIL) 40 MG tablet 1 tablet     metoprolol succinate (TOPROL-XL) 50 MG 24 hr tablet TAKE 1 TABLET(50 MG) BY MOUTH DAILY WITH OR IMMEDIATELY FOLLOWING A MEAL*DUE FOR APPOINTMENT* 90 tablet 0   No facility-administered medications prior to visit.    Allergies  Allergen Reactions   Hydrocodone     Causes hallucinations.  Patient states she can tolerate oxycodone.   Penicillins Swelling    ROS See HPI    Objective:    Physical Exam Constitutional:      General: She is not in acute distress.    Appearance: Normal appearance. She is not ill-appearing.  HENT:     Head: Normocephalic and atraumatic.     Right Ear: Tympanic membrane, ear canal and external ear normal.     Left Ear: Tympanic membrane, ear canal and external ear  normal.  Eyes:     Extraocular Movements: Extraocular movements intact.     Pupils: Pupils are equal, round, and reactive to light.  Cardiovascular:     Rate and Rhythm: Normal rate and regular rhythm.     Heart sounds: Normal heart sounds. No murmur heard.  No gallop.  Pulmonary:     Effort: Pulmonary effort is normal. No respiratory distress.     Breath sounds: Normal breath sounds. No wheezing or rales.  Abdominal:     General: Bowel sounds are normal. There is no distension.     Palpations: Abdomen is soft.     Tenderness: There is no abdominal tenderness. There is no guarding.  Musculoskeletal:     Comments: 5/5 strength in both upper and lower extremities    Skin:    General: Skin is warm and dry.  Neurological:     Mental Status: She is alert and oriented to person, place, and time.     Deep Tendon Reflexes:     Reflex Scores:      Patellar reflexes are 2+ on the right side and 2+ on the left side. Psychiatric:        Mood and Affect: Mood normal.        Behavior: Behavior normal.        Judgment: Judgment normal.     BP (!) 138/57 (BP Location: Right Arm, Patient Position: Sitting, Cuff Size: Large)   Pulse (!) 57   Temp 98.1 F (36.7 C) (Oral)   Resp 16   Ht '5\' 6"'$  (1.676 m)   Wt 300 lb (136.1 kg)   LMP 02/14/2016   SpO2 100%   BMI 48.42 kg/m  Wt Readings from Last 3 Encounters:  09/01/22 300 lb (136.1 kg)  08/14/22 293 lb 9.6 oz (133.2 kg)  07/05/22 289 lb (131.1 kg)       Assessment & Plan:   Problem List Items Addressed This Visit       Unprioritized   Primary hypertension    BP Readings from Last 3 Encounters:  09/01/22 (!) 138/57  08/14/22 (!) 144/77  05/17/22 (!) 159/88  At goal on amlodipine, hctz, lisinopril and toprol. Continue same.       Relevant Medications   metoprolol succinate (TOPROL-XL) 50 MG 24 hr tablet   lisinopril (ZESTRIL) 40 MG tablet   hydrochlorothiazide (HYDRODIURIL) 25 MG tablet   amLODipine (NORVASC) 10 MG tablet    Other Relevant Orders   Basic metabolic panel   Endometrial cancer ([place])    Saw GYN this past spring. They recommended 5 year follow up for IUD exchange.        Diabetes mellitus type 2 in obese [facility]) - Primary    Lab Results  Component Value Date   HGBA1C 5.9 04/19/2022  A1C is at goal. Continue work on healthy diet/exercise/weight loss.       Relevant Medications   lisinopril (ZESTRIL) 40 MG tablet   Other Relevant Orders   Basic metabolic panel   Hemoglobin A1c   Meds ordered this encounter  Medications   metoprolol succinate (TOPROL-XL) 50 MG 24 hr tablet    Sig: TAKE 1 TABLET(50 MG) BY MOUTH DAILY WITH OR IMMEDIATELY FOLLOWING A meal    Dispense:  90 tablet    Refill:  1    Order Specific Question:   Supervising Provider    Answer:   Penni Homans A [[id number]]   lisinopril (ZESTRIL) 40 MG tablet    Sig: 1 tablet    Dispense:  90 tablet    Refill:  1    Order Specific Question:   Supervising Provider    Answer:   Penni Homans A [[id number]]   hydrochlorothiazide (HYDRODIURIL) 25 MG tablet    Sig: Take 1 tablet (25 mg total) by mouth  daily.    Dispense:  90 tablet    Refill:  1    Order Specific Question:   Supervising Provider    Answer:   Penni Homans A [[id number]]   amLODipine (NORVASC) 10 MG tablet    Sig: Take 1 tablet (10 mg total) by mouth daily.    Dispense:  90 tablet    Refill:  1    Order Specific Question:   Supervising Provider    Answer:   Penni Homans A [[id number]]    I, Nance Pear, NP, personally preformed the services described in this documentation.  All medical record entries made by the scribe were at my direction and in my presence.  I have reviewed the chart and discharge instructions (if applicable) and agree that the record reflects my personal performance and is accurate and complete. 09/01/2022   I,Amber Collins,acting as a scribe for Nance Pear, NP.,have documented all relevant documentation on the behalf of Nance Pear,  NP,as directed by  Nance Pear, NP while in the presence of Nance Pear, NP.    Nance Pear, NP

## 2022-09-01 NOTE — Assessment & Plan Note (Addendum)
BP Readings from Last 3 Encounters:  09/01/22 (!) 138/57  08/14/22 (!) 144/77  05/17/22 (!) 159/88   At goal on amlodipine, hctz, lisinopril and toprol. Continue same.

## 2022-09-01 NOTE — Assessment & Plan Note (Signed)
Lab Results  Component Value Date   HGBA1C 5.9 04/19/2022   A1C is at goal. Continue work on healthy diet/exercise/weight loss.

## 2022-10-19 ENCOUNTER — Encounter (INDEPENDENT_AMBULATORY_CARE_PROVIDER_SITE_OTHER): Payer: Self-pay | Admitting: Internal Medicine

## 2022-11-01 ENCOUNTER — Ambulatory Visit (INDEPENDENT_AMBULATORY_CARE_PROVIDER_SITE_OTHER): Payer: Commercial Managed Care - HMO | Admitting: Internal Medicine

## 2022-11-01 ENCOUNTER — Encounter (INDEPENDENT_AMBULATORY_CARE_PROVIDER_SITE_OTHER): Payer: Self-pay | Admitting: Internal Medicine

## 2022-11-01 VITALS — BP 136/86 | HR 74 | Temp 98.3°F | Ht 67.0 in | Wt 306.0 lb

## 2022-11-01 DIAGNOSIS — E1169 Type 2 diabetes mellitus with other specified complication: Secondary | ICD-10-CM | POA: Diagnosis not present

## 2022-11-01 DIAGNOSIS — Z0289 Encounter for other administrative examinations: Secondary | ICD-10-CM

## 2022-11-01 DIAGNOSIS — E785 Hyperlipidemia, unspecified: Secondary | ICD-10-CM

## 2022-11-01 DIAGNOSIS — Z6841 Body Mass Index (BMI) 40.0 and over, adult: Secondary | ICD-10-CM | POA: Diagnosis not present

## 2022-11-01 DIAGNOSIS — I10 Essential (primary) hypertension: Secondary | ICD-10-CM | POA: Diagnosis not present

## 2022-11-01 NOTE — Progress Notes (Signed)
Office: [phone]  /  Fax: [phone]   Initial Visit  Sheila Carlson was seen in clinic today to evaluate for obesity. She is interested in losing weight to improve overall health and reduce the risk of weight related complications. She presents today to review program treatment options, initial physical assessment, and evaluation.   Patient was referred by an acquaintance.  She notes her friend has been doing well.  She is married works at a daycare, husband is a Biomedical scientist and has a grandson who is overweight.  She was referred by: Friend or Family  When asked what else they would like to accomplish? She states: Adopt healthier eating patterns, Improve existing medical conditions, and Improve quality of life  When asked how has your weight affected you? She states: Has affected self-esteem, Contributed to medical problems, Contributed to orthopedic problems or mobility issues, Having fatigue, Having poor endurance, and Problems with eating patterns  Some associated conditions: Hypertension, Hyperlipidemia, Diabetes, and GERD  Contributing factors: Family history, Nutritional, Medications, Stress, Reduced physical activity, Eating patterns, Life event, and Menopause  Weight promoting medications identified: Beta-blockers  Current nutrition plan: None  Current level of physical activity: None  Current or previous pharmacotherapy: Phentermine  Response to medication: Has lost weight but medication was discontinued because of hypertension.   Past medical history includes:   Past Medical History:  Diagnosis Date   Allergy    Anemia    Anxiety    Cancer ([place])    endometrial   Cataract    COVID-19 04/2020   GERD (gastroesophageal reflux disease)    HEMATOCHEZIA 06/25/2007   Annotation: bright red blood on tissue once or twice a month when constipated.   Qualifier: Diagnosis of  By: Marshell Levan MD, Vicente Serene     History of low potassium    Hyperlipidemia 04/16/2020   Hypertension     Pre-diabetes      Objective:   BP 136/86   Pulse 74   Temp 98.3 F (36.8 C)   Ht '5\' 7"'$  (1.702 m)   Wt (!) 306 lb (138.8 kg)   LMP 02/14/2016   SpO2 99%   BMI 47.93 kg/m  She was weighed on the bioimpedance scale: Body mass index is 47.93 kg/m.  Peak Weight: 325,Visceral Fat Rating: 20, Body Fat%: 54.6, Weight trend over the last 12 months: Decreasing  General:  Alert, oriented and cooperative. Patient is in no acute distress.  Respiratory: Normal respiratory effort, no problems with respiration noted  Extremities: Normal range of motion.    Mental Status: Normal mood and affect. Normal behavior. Normal judgment and thought content.   Assessment and Plan:  1. Class 3 severe obesity with serious comorbidity and body mass index (BMI) of 45.0 to 49.9 in adult, unspecified obesity type ([place]) We reviewed weight, biometrics, associated medical conditions and contributing factors with patient. She would benefit from weight loss therapy via a modified calorie, low-carb, high-protein nutritional plan tailored to their REE (resting energy expenditure) which will be determined by indirect calorimetry.  We will also assess for cardiometabolic risk and nutritional derangements via fasting serologies at her next appointment.   2. Primary hypertension Blood pressure slightly above target.  She is currently on amlodipine, hydrochlorothiazide, lisinopril and metoprolol.  Metoprolol may contribute to weight gain.  Reviewed most recent renal parameters which were within normal limits.  No signs of symptoms of orthostasis.  Continue current regimen  3. Diabetes mellitus type 2 in obese ([place]) I reviewed several A1c readings.  She has had one at 6.8, 6.6 which meets criteria for type 2 diabetes.  She is not aware of diagnosis.  I reviewed with her diagnostic criteria and we will being checking an A1c with her intake labs.  Patient would benefit from diabetes education.  She will also need to be updated on  diabetes preventive measures.  4. Hyperlipidemia associated with type 2 diabetes mellitus ([place]) Her last LDL was 87.  She is on moderate intensity statin therapy which is adequate for risk category.  No side effects.  Continue statin therapy       Obesity Treatment / Action Plan:  Patient will work on garnering support from family and friends to begin weight loss journey. Will work on eliminating or reducing the presence of highly palatable, calorie dense foods in the home. Will complete provided nutritional and psychosocial assessment questionnaire before the next appointment. Will be scheduled for indirect calorimetry to determine resting energy expenditure in a fasting state.  This will allow [place] to create a reduced calorie, high-protein meal plan to promote loss of fat mass while preserving muscle mass. Will work on reducing intake of added sugars, simple sugars and processed carbs. Was counseled on nutritional approaches to weight loss and benefits of complex carbs and high quality protein as part of nutritional weight management. Was counseled on pharmacotherapy and role as an adjunct in weight management.   Obesity Education Performed Today:  She was weighed on the bioimpedance scale and results were discussed and documented in the synopsis.  We discussed obesity as a disease and the importance of a more detailed evaluation of all the factors contributing to the disease.  We discussed the importance of long term lifestyle changes which include nutrition, exercise and behavioral modifications as well as the importance of customizing this to her specific health and social needs.  We discussed the benefits of reaching a healthier weight to alleviate the symptoms of existing conditions and reduce the risks of the biomechanical, metabolic and psychological effects of obesity.  Sheila Carlson appears to be in the action stage of change and states they are ready to start intensive lifestyle  modifications and behavioral modifications.  30 minutes was spent today on this visit including the above counseling, pre-visit chart review, and post-visit documentation.  Reviewed by clinician on day of visit: allergies, medications, problem list, medical history, surgical history, family history, social history, and previous encounter notes.      I have reviewed the above documentation for accuracy and completeness, and I agree with the above.  Thomes Dinning, MD

## 2022-11-02 ENCOUNTER — Encounter (INDEPENDENT_AMBULATORY_CARE_PROVIDER_SITE_OTHER): Payer: Commercial Managed Care - HMO | Admitting: Family

## 2022-11-02 DIAGNOSIS — J111 Influenza due to unidentified influenza virus with other respiratory manifestations: Secondary | ICD-10-CM

## 2022-11-03 DIAGNOSIS — J111 Influenza due to unidentified influenza virus with other respiratory manifestations: Secondary | ICD-10-CM | POA: Insufficient documentation

## 2022-11-03 MED ORDER — OSELTAMIVIR PHOSPHATE 75 MG PO CAPS: 75.0000 mg | ORAL_CAPSULE | Freq: Two times a day (BID) | ORAL | 0 refills | Status: DC

## 2022-11-03 NOTE — Telephone Encounter (Signed)
Please see the MyChart message reply(ies) for my assessment and plan.  The patient gave consent for this Medical Advice Message and is aware that it may result in a bill to their insurance company as well as the possibility that this may result in a co-payment or deductible. They are an established patient, but are not seeking medical advice exclusively about a problem treated during an in person or video visit in the last 7 days. I did not recommend an in person or video visit within 7 days of my reply.  I spent a total of 5 provider minutes cumulative time within 7 days through [place], NP

## 2022-11-09 ENCOUNTER — Encounter: Payer: Self-pay | Admitting: Family

## 2022-11-09 DIAGNOSIS — K648 Other hemorrhoids: Secondary | ICD-10-CM

## 2022-11-09 DIAGNOSIS — K5900 Constipation, unspecified: Secondary | ICD-10-CM

## 2022-11-09 NOTE — Telephone Encounter (Signed)
Please see the MyChart message reply(ies) for my assessment and plan.  The patient gave consent for this Medical Advice Message and is aware that it may result in a bill to their insurance company as well as the possibility that this may result in a co-payment or deductible. They are an established patient, but are not seeking medical advice exclusively about a problem treated during an in person or video visit in the last 7 days. I did not recommend an in person or video visit within 7 days of my reply.  I spent a total of 5 minutes cumulative provider time within 7 days through MyChart messaging.  Mingo Siegert S O'Sullivan, NP  

## 2022-11-29 ENCOUNTER — Ambulatory Visit (INDEPENDENT_AMBULATORY_CARE_PROVIDER_SITE_OTHER): Payer: Commercial Managed Care - HMO | Admitting: Internal Medicine

## 2022-11-29 ENCOUNTER — Encounter (INDEPENDENT_AMBULATORY_CARE_PROVIDER_SITE_OTHER): Payer: Self-pay | Admitting: Internal Medicine

## 2022-11-29 VITALS — BP 138/86 | HR 68 | Temp 98.0°F | Ht 67.0 in | Wt 307.0 lb

## 2022-11-29 DIAGNOSIS — I10 Essential (primary) hypertension: Secondary | ICD-10-CM

## 2022-11-29 DIAGNOSIS — Z1331 Encounter for screening for depression: Secondary | ICD-10-CM

## 2022-11-29 DIAGNOSIS — R5383 Other fatigue: Secondary | ICD-10-CM

## 2022-11-29 DIAGNOSIS — E785 Hyperlipidemia, unspecified: Secondary | ICD-10-CM

## 2022-11-29 DIAGNOSIS — E1169 Type 2 diabetes mellitus with other specified complication: Secondary | ICD-10-CM | POA: Diagnosis not present

## 2022-11-29 DIAGNOSIS — Z6841 Body Mass Index (BMI) 40.0 and over, adult: Secondary | ICD-10-CM

## 2022-11-29 DIAGNOSIS — R0602 Shortness of breath: Secondary | ICD-10-CM

## 2022-11-29 DIAGNOSIS — R29818 Other symptoms and signs involving the nervous system: Secondary | ICD-10-CM | POA: Diagnosis not present

## 2022-11-30 ENCOUNTER — Other Ambulatory Visit: Payer: Self-pay | Admitting: Family

## 2022-12-01 LAB — COMPREHENSIVE METABOLIC PANEL
ALT: 9 IU/L (ref 0–32)
AST: 8 IU/L (ref 0–40)
Albumin/Globulin Ratio: 1.5 (ref 1.2–2.2)
Albumin: 4.2 g/dL (ref 3.9–4.9)
Alkaline Phosphatase: 74 IU/L (ref 44–121)
BUN/Creatinine Ratio: 18 (ref 12–28)
BUN: 14 mg/dL (ref 8–27)
Bilirubin Total: 0.4 mg/dL (ref 0.0–1.2)
CO2: 25 mmol/L (ref 20–29)
Calcium: 9 mg/dL (ref 8.7–10.3)
Chloride: 99 mmol/L (ref 96–106)
Creatinine, Ser: 0.8 mg/dL (ref 0.57–1.00)
Globulin, Total: 2.8 g/dL (ref 1.5–4.5)
Glucose: 121 mg/dL — ABNORMAL HIGH (ref 70–99)
Potassium: 3.7 mmol/L (ref 3.5–5.2)
Sodium: 141 mmol/L (ref 134–144)
Total Protein: 7 g/dL (ref 6.0–8.5)
eGFR: 83 mL/min/{1.73_m2} (ref >59)

## 2022-12-01 LAB — CBC WITH DIFFERENTIAL/PLATELET
Basophils Absolute: 0 10*3/uL (ref 0.0–0.2)
Basos: 1 %
EOS (ABSOLUTE): 0.5 10*3/uL — ABNORMAL HIGH (ref 0.0–0.4)
Eos: 10 %
Hematocrit: 39.3 % (ref 34.0–46.6)
Hemoglobin: 12.9 g/dL (ref 11.1–15.9)
Immature Grans (Abs): 0 10*3/uL (ref 0.0–0.1)
Immature Granulocytes: 1 %
Lymphocytes Absolute: 1.6 10*3/uL (ref 0.7–3.1)
Lymphs: 33 %
MCH: 29.3 pg (ref 26.6–33.0)
MCHC: 32.8 g/dL (ref 31.5–35.7)
MCV: 89 fL (ref 79–97)
Monocytes Absolute: 0.3 10*3/uL (ref 0.1–0.9)
Monocytes: 7 %
Neutrophils Absolute: 2.3 10*3/uL (ref 1.4–7.0)
Neutrophils: 48 %
Platelets: 332 10*3/uL (ref 150–450)
RBC: 4.4 x10E6/uL (ref 3.77–5.28)
RDW: 13.9 % (ref 11.7–15.4)
WBC: 4.7 10*3/uL (ref 3.4–10.8)

## 2022-12-01 LAB — MICROALBUMIN / CREATININE URINE RATIO
Creatinine, Urine: 160 mg/dL
Microalb/Creat Ratio: 7 mg/g creat (ref 0–29)
Microalbumin, Urine: 10.8 ug/mL

## 2022-12-01 LAB — TSH: TSH: 3.69 u[IU]/mL (ref 0.450–4.500)

## 2022-12-01 LAB — LIPID PANEL WITH LDL/HDL RATIO
Cholesterol, Total: 150 mg/dL (ref 100–199)
HDL: 42 mg/dL (ref >39)
LDL Chol Calc (NIH): 88 mg/dL (ref 0–99)
LDL/HDL Ratio: 2.1 ratio (ref 0.0–3.2)
Triglycerides: 107 mg/dL (ref 0–149)
VLDL Cholesterol Cal: 20 mg/dL (ref 5–40)

## 2022-12-01 LAB — HEMOGLOBIN A1C
Est. average glucose Bld gHb Est-mCnc: 140 mg/dL
Hgb A1c MFr Bld: 6.5 % — ABNORMAL HIGH (ref 4.8–5.6)

## 2022-12-01 LAB — VITAMIN B12: Vitamin B-12: 645 pg/mL (ref 232–1245)

## 2022-12-01 LAB — VITAMIN D 25 HYDROXY (VIT D DEFICIENCY, FRACTURES): Vit D, 25-Hydroxy: 35.1 ng/mL (ref 30.0–100.0)

## 2022-12-01 LAB — INSULIN, RANDOM: INSULIN: 13.4 u[IU]/mL (ref 2.6–24.9)

## 2022-12-14 NOTE — Progress Notes (Signed)
Chief Complaint:   OBESITY Sheila Carlson (MR# [id number]) is a 61 y.o. female who presents for evaluation and treatment of obesity and related comorbidities. Current BMI is Body mass index is 48.08 kg/m. [name] has been struggling with her weight for many years and has been unsuccessful in either losing weight, maintaining weight loss, or reaching her healthy weight goal.  [name] is currently in the action stage of change and ready to dedicate time achieving and maintaining a healthier weight. [name] is interested in becoming our patient and working on intensive lifestyle modifications including (but not limited to) diet and exercise for weight loss.  [name]'s habits were reviewed today and are as follows: she thinks her family will eat healthier with her, she struggles with family and or coworkers weight loss sabotage, her desired weight loss is 57 lbs, she has been heavy most of her life, she started gaining weight after having children, she is a picky eater and doesn't like to eat healthier foods, she has significant food cravings issues, she snacks frequently in the evenings, she skips meals frequently, she is frequently drinking liquids with calories, she frequently makes poor food choices, and she struggles with emotional eating.  Depression Screen [name]'s Food and Mood (modified PHQ-9) score was 8.  Subjective:   1. Other fatigue Discussed labs with patient today. [name] admits to daytime somnolence and denies waking up still tired. Patient has a history of symptoms of daytime fatigue. [name] generally gets 8 hours of sleep per night, and states that she has generally restful sleep. Snoring is present. Apneic episodes are present. Epworth Sleepiness Score is 10.   2. SOB (shortness of breath) on exertion [name] notes increasing shortness of breath with exercising and seems to be worsening over time with weight gain. She notes getting out of breath sooner with activity than she used to.  This has gotten worse recently. [name] denies shortness of breath at rest or orthopnea.  3. Primary hypertension BP above target of <130/80. [name] is on BB, amlodipine, lisinopril. She has not taken her meds today.  4. Diabetes mellitus type 2 in obese [name]) Patient is unaware of diagnosis and needs confirmatory testing.  5. Hyperlipidemia associated with type 2 diabetes mellitus ([place]) She is on moderate intensity statin with no side effects.  6. Suspected sleep apnea Elevated Epworth, high risk phenotype. Pt is agreeable to referral for sleep study. Pt with loud snoring and witnessed apneas.  Assessment/Plan:   1. Other fatigue [name] does feel that her weight is causing her energy to be lower than it should be. Fatigue may be related to obesity, depression or many other causes. Labs will be ordered, and in the meanwhile, [name] will focus on self care including making healthy food choices, increasing physical activity and focusing on stress reduction.  Lab/Orders today: - EKG 12-Lead - Vitamin B12 - CBC with Differential/Platelet - Comprehensive metabolic panel  2. SOB (shortness of breath) on exertion [name] does feel that she gets out of breath more easily that she used to when she exercises. [name]'s shortness of breath appears to be obesity related and exercise induced. She has agreed to work on weight loss and gradually increase exercise to treat her exercise induced shortness of breath. Will continue to monitor closely.  3. Primary hypertension Check CMP. Weight loss therapy. Continue current regimen.   4. Diabetes mellitus type 2 in obese [name]) If diagnosis is confirmed, patient needs diabetes education, update on diabetes prevention measures, and  would benefit from Metformin and/or GLP-1.  Lab/Orders today: - Hemoglobin A1c - Insulin, random - Lipid Panel With LDL/HDL Ratio - Urine Microalbumin w/creat. ratio  5. Hyperlipidemia associated with type 2 diabetes mellitus  ([place]) Continue  statin therapy.  Lab/Orders today: - Lipid Panel With LDL/HDL Ratio  6. Suspected sleep apnea Refer to sleep medicine at [facility].  7. Depression screen [name] had a positive depression screening. Depression is commonly associated with obesity and often results in emotional eating behaviors. We will monitor this closely and work on CBT to help improve the non-hunger eating patterns. Referral to Psychology may be required if no improvement is seen as she continues in our clinic.  8. Class 3 severe obesity with serious comorbidity and body mass index (BMI) of 45.0 to 49.9 in adult, unspecified obesity type ([place]) Lab/Orders today: - TSH - VITAMIN D 25 Hydroxy (Vit-D Deficiency, Fractures)  [name] is currently in the action stage of change and her goal is to continue with weight loss efforts. I recommend [name] begin the structured treatment plan as follows:  She has agreed to the Category 3 Plan.  Exercise goals: All adults should avoid inactivity. Some physical activity is better than none, and adults who participate in any amount of physical activity gain some health benefits.   Behavioral modification strategies: increasing lean protein intake, decreasing simple carbohydrates, increasing vegetables, increasing water intake, decreasing liquid calories, increasing high fiber foods, decreasing eating out, no skipping meals, meal planning and cooking strategies, keeping healthy foods in the home, better snacking choices, avoiding temptations, and planning for success.  She was informed of the importance of frequent follow-up visits to maximize her success with intensive lifestyle modifications for her multiple health conditions. She was informed we would discuss her lab results at her next visit unless there is a critical issue that needs to be addressed sooner. [name] agreed to keep her next visit at the agreed upon time to discuss these results.  Objective:   Blood  pressure 138/86, pulse 68, temperature 98 F (36.7 C), height '5\' 7"'$  (1.702 m), weight (!) 307 lb (139.3 kg), last menstrual period 02/14/2016, SpO2 100 %. Body mass index is 48.08 kg/m.  EKG: Normal sinus rhythm, rate 59.  Indirect Calorimeter completed today shows a VO2 of 302 and a REE of 2088.  Her calculated basal metabolic rate is 2025 thus her basal metabolic rate is better than expected.  General: Cooperative, alert, well developed, in no acute distress. HEENT: Conjunctivae and lids unremarkable. Cardiovascular: Regular rhythm.  Lungs: Normal work of breathing. Neurologic: No focal deficits.   Lab Results  Component Value Date   CREATININE 0.80 11/29/2022   BUN 14 11/29/2022   NA 141 11/29/2022   K 3.7 11/29/2022   CL 99 11/29/2022   CO2 25 11/29/2022   Lab Results  Component Value Date   ALT 9 11/29/2022   AST 8 11/29/2022   ALKPHOS 74 11/29/2022   BILITOT 0.4 11/29/2022   Lab Results  Component Value Date   HGBA1C 6.5 (H) 11/29/2022   HGBA1C 6.6 (H) 09/01/2022   HGBA1C 5.9 04/19/2022   HGBA1C 6.8 (H) 11/15/2021   HGBA1C 6.8 (H) 04/12/2021   Lab Results  Component Value Date   INSULIN 13.4 11/29/2022   INSULIN 11.9 05/12/2020   Lab Results  Component Value Date   TSH 3.690 11/29/2022   Lab Results  Component Value Date   CHOL 150 11/29/2022   HDL 42 11/29/2022   LDLCALC 88 11/29/2022  TRIG 107 11/29/2022   CHOLHDL 4 04/19/2022   Lab Results  Component Value Date   WBC 4.7 11/29/2022   HGB 12.9 11/29/2022   HCT 39.3 11/29/2022   MCV 89 11/29/2022   PLT 332 11/29/2022    Attestation Statements:   Reviewed by clinician on day of visit: allergies, medications, problem list, medical history, surgical history, family history, social history, and previous encounter notes.  Time spent on visit including pre-visit chart review and post-visit charting and care was 40 minutes.   I, Kathlene November, BS, CMA, am acting as transcriptionist for Thomes Dinning, MD.  I have reviewed the above documentation for accuracy and completeness, and I agree with the above. -Thomes Dinning, MD

## 2022-12-20 ENCOUNTER — Ambulatory Visit (INDEPENDENT_AMBULATORY_CARE_PROVIDER_SITE_OTHER): Payer: Commercial Managed Care - HMO | Admitting: Internal Medicine

## 2022-12-20 ENCOUNTER — Other Ambulatory Visit: Payer: Self-pay

## 2022-12-20 ENCOUNTER — Emergency Department (HOSPITAL_BASED_OUTPATIENT_CLINIC_OR_DEPARTMENT_OTHER)
Admission: EM | Admit: 2022-12-20 | Discharge: 2022-12-20 | Disposition: A | Discharge status: Home / Self Care | Payer: Commercial Managed Care - HMO | Attending: Emergency Medicine | Admitting: Emergency Medicine

## 2022-12-20 ENCOUNTER — Ambulatory Visit: Admit: 2022-12-20 | Discharge status: Against Medical Advice | Payer: Commercial Managed Care - HMO

## 2022-12-20 ENCOUNTER — Telehealth: Payer: Self-pay | Admitting: Family

## 2022-12-20 ENCOUNTER — Emergency Department (HOSPITAL_BASED_OUTPATIENT_CLINIC_OR_DEPARTMENT_OTHER): Payer: Commercial Managed Care - HMO

## 2022-12-20 DIAGNOSIS — Z79899 Other long term (current) drug therapy: Secondary | ICD-10-CM | POA: Insufficient documentation

## 2022-12-20 DIAGNOSIS — Z8616 Personal history of COVID-19: Secondary | ICD-10-CM | POA: Diagnosis not present

## 2022-12-20 DIAGNOSIS — Z8542 Personal history of malignant neoplasm of other parts of uterus: Secondary | ICD-10-CM | POA: Diagnosis not present

## 2022-12-20 DIAGNOSIS — R42 Dizziness and giddiness: Secondary | ICD-10-CM | POA: Insufficient documentation

## 2022-12-20 DIAGNOSIS — I10 Essential (primary) hypertension: Secondary | ICD-10-CM | POA: Diagnosis not present

## 2022-12-20 LAB — URINALYSIS, ROUTINE W REFLEX MICROSCOPIC
Bilirubin Urine: NEGATIVE
Glucose, UA: NEGATIVE mg/dL
Hgb urine dipstick: NEGATIVE
Ketones, ur: NEGATIVE mg/dL
Leukocytes,Ua: NEGATIVE
Nitrite: NEGATIVE
Protein, ur: NEGATIVE mg/dL
Specific Gravity, Urine: 1.01 (ref 1.005–1.030)
pH: 7 (ref 5.0–8.0)

## 2022-12-20 LAB — BASIC METABOLIC PANEL
Anion gap: 7 (ref 5–15)
BUN: 9 mg/dL (ref 8–23)
CO2: 28 mmol/L (ref 22–32)
Calcium: 8.7 mg/dL — ABNORMAL LOW (ref 8.9–10.3)
Chloride: 103 mmol/L (ref 98–111)
Creatinine, Ser: 0.67 mg/dL (ref 0.44–1.00)
GFR, Estimated: >60 mL/min (ref >60)
Glucose, Bld: 127 mg/dL — ABNORMAL HIGH (ref 70–99)
Potassium: 3.5 mmol/L (ref 3.5–5.1)
Sodium: 138 mmol/L (ref 135–145)

## 2022-12-20 LAB — CBC
HCT: 38.2 % (ref 36.0–46.0)
Hemoglobin: 12.8 g/dL (ref 12.0–15.0)
MCH: 29.6 pg (ref 26.0–34.0)
MCHC: 33.5 g/dL (ref 30.0–36.0)
MCV: 88.2 fL (ref 80.0–100.0)
Platelets: 350 10*3/uL (ref 150–400)
RBC: 4.33 MIL/uL (ref 3.87–5.11)
RDW: 13.8 % (ref 11.5–15.5)
WBC: 5.1 10*3/uL (ref 4.0–10.5)
nRBC: 0 % (ref 0.0–0.2)

## 2022-12-20 LAB — CBG MONITORING, ED: Glucose-Capillary: 117 mg/dL — ABNORMAL HIGH (ref 70–99)

## 2022-12-20 NOTE — ED Notes (Signed)
Patient ambulated with a steady gait; denies dizziness. No other complaints at this time. EDP has been notified.

## 2022-12-20 NOTE — ED Triage Notes (Signed)
Patient presents to ED via POV from home. Here with reports of dizziness. Reports "I'm in a health and wellness program and I was told my blood sugar is high".

## 2022-12-20 NOTE — Telephone Encounter (Signed)
Patient called stating she was really dizzy I sent her to triaged just incase

## 2022-12-20 NOTE — Telephone Encounter (Signed)
Pt in ED.  

## 2022-12-20 NOTE — ED Provider Notes (Signed)
Patient care was taken over from Dr. Alvino Chapel.  Patient was awaiting MRI.  MRI was negative for stroke or other acute abnormality.  Patient currently denies any dizziness.  She is able to ambulate without ataxia or dizziness.  She has no neurologic deficits.  She was discharged home in good condition.  Will follow-up with her primary care doctor.  Return precautions were given.   Sheila Johns, MD 12/20/22 [phone]

## 2022-12-20 NOTE — Telephone Encounter (Signed)
Initial Comment Caller states that she was diagnosed with diabetes. Does she need to make an appointment? She has had extreme dizziness. Translation No Disp. Time Eilene Ghazi Time) Disposition Final User 12/20/2022 11:36:52 AM Attempt made - message left [place], RN, Estill Bamberg 12/20/2022 11:59:40 AM Attempt made - no message left [place], RN, Estill Bamberg 12/20/2022 12:12:46 PM FINAL ATTEMPT MADE - no message left Yes Hum[place]et, RN, Estill Bamberg Final Disposition 12/20/2022 12:12:46 PM FINAL ATTEMPT MADE - no message left Yes [place], RN, Estill Bamberg

## 2022-12-20 NOTE — ED Provider Notes (Signed)
[facility] EMERGENCY DEPARTMENT Provider Note   CSN: [id number] Arrival date & time: 12/20/22  1209     History  Chief Complaint  Patient presents with   Dizziness    Sheila Carlson is a 61 y.o. female.   Dizziness Patient presents with dizziness.  Somewhat yesterday and today.  Feels as if there is some movement.  Worse when she tries to get up.  Worse with looking around.  Reportedly told her blood sugars have been high at her health and wellness center.  No headache.  No confusion.  Has not had episodes like this before.  States a day or 2 ago may have had a little difficulty moving her right arm but thought she had just laid on it wrong.    Past Medical History:  Diagnosis Date   Allergy    Anemia    Anxiety    Cancer ([place])    endometrial   Cataract    COVID-19 04/2020   GERD (gastroesophageal reflux disease)    HEMATOCHEZIA 06/25/2007   Annotation: bright red blood on tissue once or twice a month when constipated.   Qualifier: Diagnosis of  By: Marshell Levan MD, Vicente Serene     History of low potassium    Hyperlipidemia 04/16/2020   Hypertension    Pre-diabetes    Home Medications Prior to Admission medications   Medication Sig Start Date End Date Taking? Authorizing Provider  amLODipine (NORVASC) 10 MG tablet TAKE 1 TABLET(10 MG) BY MOUTH DAILY 11/30/22   Debbrah Alar, NP  atorvastatin (LIPITOR) 20 MG tablet TAKE 1 TABLET(20 MG) BY MOUTH DAILY 06/19/22   Debbrah Alar, NP  hydrochlorothiazide (HYDRODIURIL) 25 MG tablet Take 1 tablet (25 mg total) by mouth daily. 09/01/22   Debbrah Alar, NP  lisinopril (ZESTRIL) 40 MG tablet Take 1 tablet (40 mg total) by mouth daily. 11/30/22   Debbrah Alar, NP  metoprolol succinate (TOPROL-XL) 50 MG 24 hr tablet TAKE 1 TABLET(50 MG) BY MOUTH DAILY WITH OR IMMEDIATELY FOLLOWING A meal 11/30/22   Debbrah Alar, NP  potassium chloride SA (KLOR-CON M) 20 MEQ tablet Take 1 tablet (20 mEq total) by mouth daily.  08/07/22   Debbrah Alar, NP      Allergies    Hydrocodone and Penicillins    Review of Systems   Review of Systems  Neurological:  Positive for dizziness.    Physical Exam Updated Vital Signs BP 138/62   Pulse (!) 56   Temp 98.5 F (36.9 C) (Oral)   Resp 18   Ht '5\' 7"'$  (1.702 m)   Wt 136.1 kg   LMP 02/14/2016   SpO2 100%   BMI 46.99 kg/m  Physical Exam Vitals and nursing note reviewed.  HENT:     Head: Atraumatic.     Right Ear: Tympanic membrane normal.     Left Ear: Tympanic membrane normal.  Eyes:     Extraocular Movements: Extraocular movements intact.  Pulmonary:     Breath sounds: No wheezing or rhonchi.  Abdominal:     Tenderness: There is no abdominal tenderness.  Musculoskeletal:        General: No tenderness.     Cervical back: Neck supple.  Skin:    General: Skin is warm.     Capillary Refill: Capillary refill takes less than 2 seconds.  Neurological:     Mental Status: She is alert and oriented to person, place, and time.     Comments: Finger-nose intact bilaterally.  Eye movements intact  but does have some nystagmus with gaze to the right.     ED Results / Procedures / Treatments   Labs (all labs ordered are listed, but only abnormal results are displayed) Labs Reviewed  BASIC METABOLIC PANEL - Abnormal; Notable for the following components:      Result Value   Glucose, Bld 127 (*)    Calcium 8.7 (*)    All other components within normal limits  URINALYSIS, ROUTINE W REFLEX MICROSCOPIC - Abnormal; Notable for the following components:   APPearance HAZY (*)    All other components within normal limits  CBG MONITORING, ED - Abnormal; Notable for the following components:   Glucose-Capillary 117 (*)    All other components within normal limits  CBC    EKG EKG Interpretation  Date/Time:  Wednesday December 20 2022 12:58:42 EST Ventricular Rate:  69 PR Interval:  178 QRS Duration: 97 QT Interval:  469 QTC Calculation: 503 R  Axis:   50 Text Interpretation: Sinus rhythm No significant change since last tracing Confirmed by Davonna Belling [id number] on 12/20/2022 3:17:04 PM  Radiology No results found.  Procedures Procedures    Medications Ordered in ED Medications - No data to display  ED Course/ Medical Decision Making/ A&P                           Medical Decision Making Amount and/or Complexity of Data Reviewed Labs: ordered. Radiology: ordered.   Patient presents with dizziness.  Yesterday but worse today.  Potentially had some difficulty moving her arm a few days ago.  Differential diagnosis for dizziness is long.  Includes vertigo, both central and peripheral, also sometimes lightheadedness.  Will get basic blood work.  Blood work is reassuring with mild hyperglycemia.  Benign peripheral exam, however does have some nystagmus.  With the potential of difficulty moving the arm previously does put more worrisome for a central cause.  We have MRI today and will evaluate with MRI for potential stroke or other central cause.  Care returned over to Dr. Tamera Punt.         Final Clinical Impression(s) / ED Diagnoses Final diagnoses:  Vertigo    Rx / DC Orders ED Discharge Orders     None         Davonna Belling, MD 12/20/22 [phone]

## 2022-12-21 ENCOUNTER — Telehealth: Payer: Self-pay

## 2022-12-21 NOTE — Telephone Encounter (Signed)
Transition Care Management Unsuccessful Follow-up Telephone Call  Date of discharge and from where:  [facility] ER, 12/20/22  Attempts:  1st Attempt  Reason for unsuccessful TCM follow-up call:  No answer/busy

## 2022-12-22 NOTE — Telephone Encounter (Signed)
Transition Care Management Unsuccessful Follow-up Telephone Call  Date of discharge and from where:  [facility] ER, 12/20/22  Attempts:  2nd Attempt  Reason for unsuccessful TCM follow-up call:  No answer/busy

## 2022-12-22 NOTE — Telephone Encounter (Signed)
Lvm for patient to call back. Will ask is she is still feeling dizziness and give provider comments.  She will also need appt for follow up on diabetes

## 2022-12-24 ENCOUNTER — Other Ambulatory Visit: Payer: Self-pay | Admitting: Family

## 2022-12-28 NOTE — Telephone Encounter (Signed)
Transition Care Management Unsuccessful Follow-up Telephone Call  Date of discharge and from where:  [facility] ER, 12/20/22  Attempts:  3rd Attempt  Reason for unsuccessful TCM follow-up call:  No answer/busy

## 2022-12-28 NOTE — Telephone Encounter (Signed)
Left detailed message for patient to call us back for follow up on her visit to the ed and to see how she is doing.

## 2023-01-03 ENCOUNTER — Ambulatory Visit (INDEPENDENT_AMBULATORY_CARE_PROVIDER_SITE_OTHER): Payer: Commercial Managed Care - HMO | Admitting: Internal Medicine

## 2023-01-10 IMAGING — US US PELVIS COMPLETE WITH TRANSVAGINAL
2 series · 13 of 25 positions shown · non-contrast
Comparison: None

CLINICAL DATA: Postmenopausal bleeding



[Series 1: us pelvis complete with transvaginal · 12 of 49 slices shown (1 of 2)]
[im 1/49]
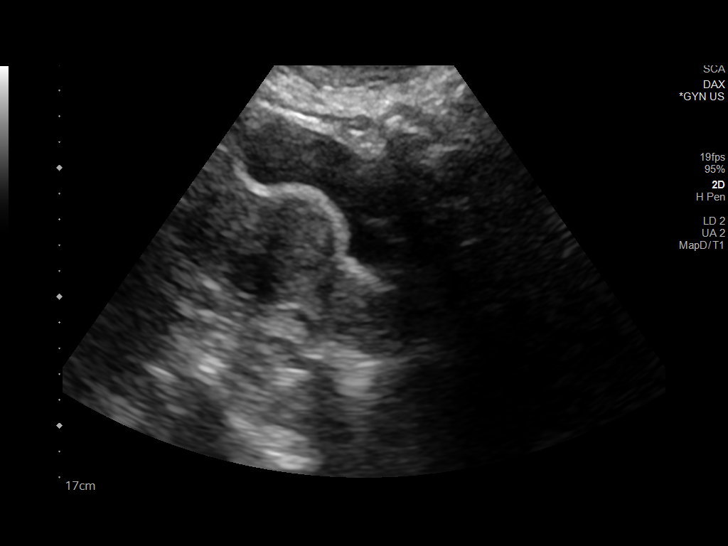
[im 5/49]
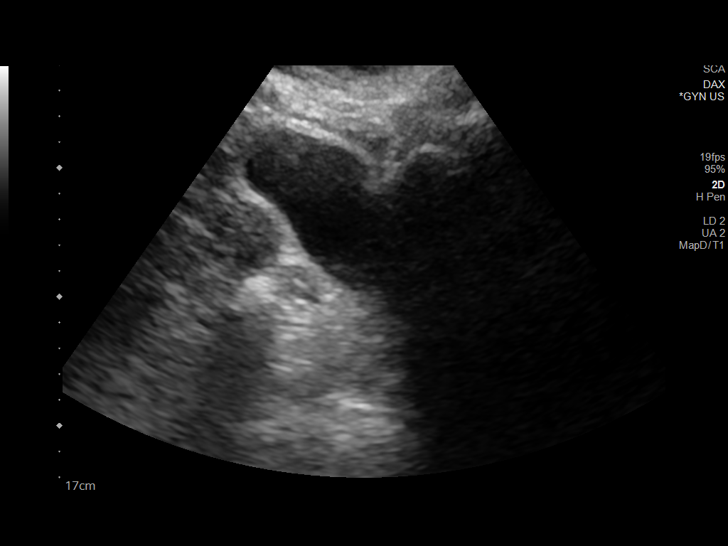
[im 9/49]
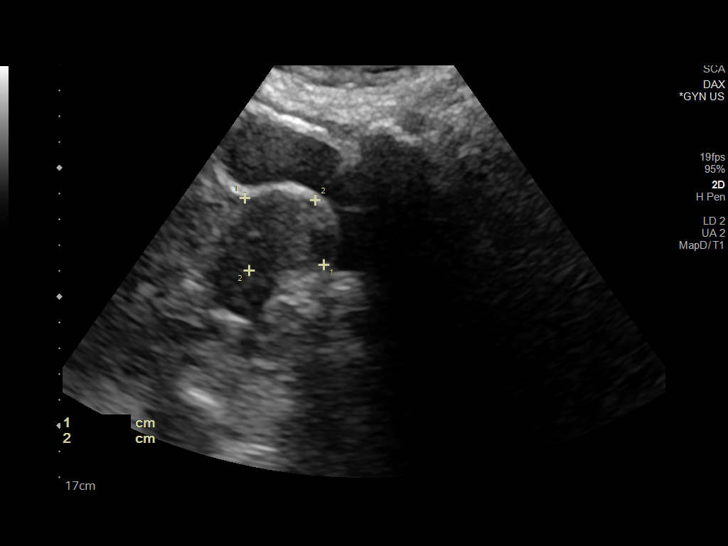
[im 14/49]
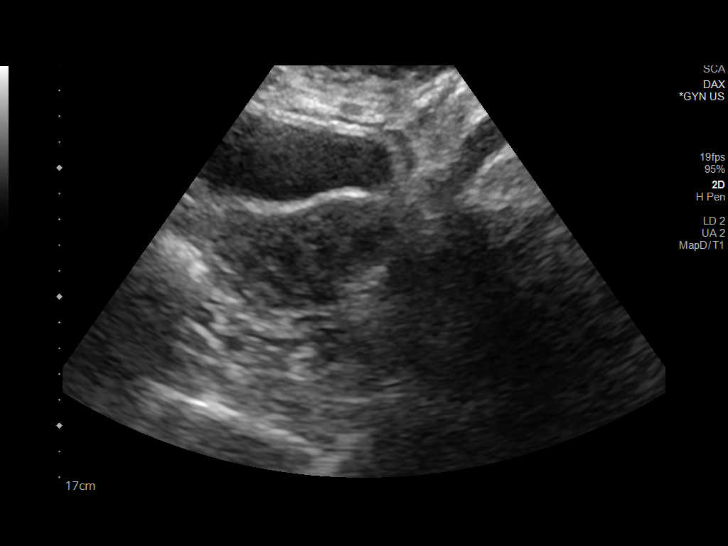
[im 18/49]
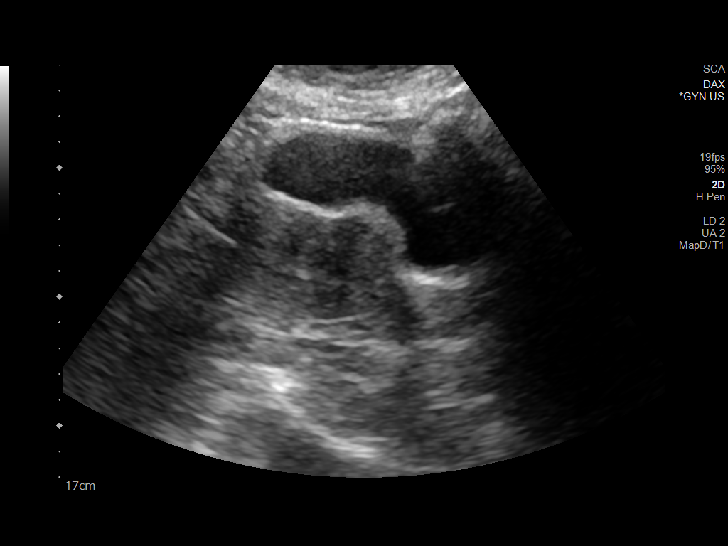
[im 22/49]
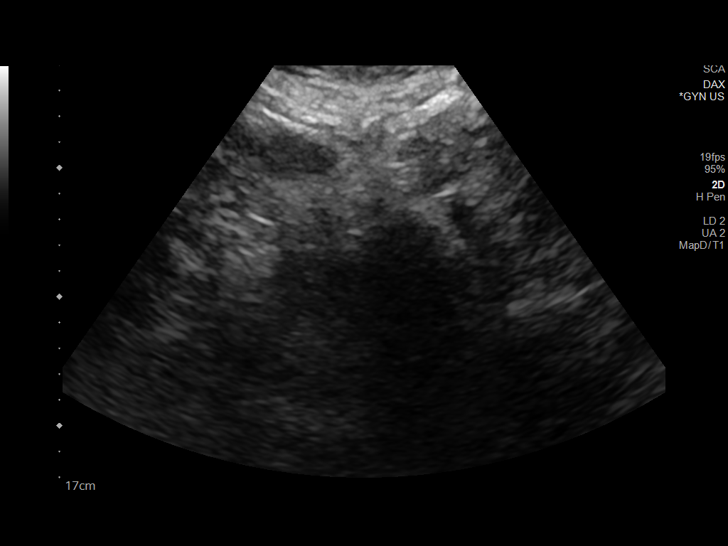
[im 27/49]
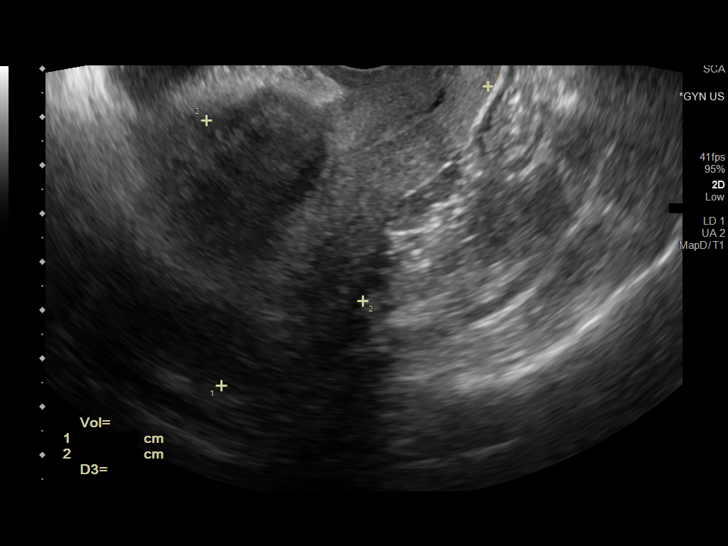
[im 31/49]
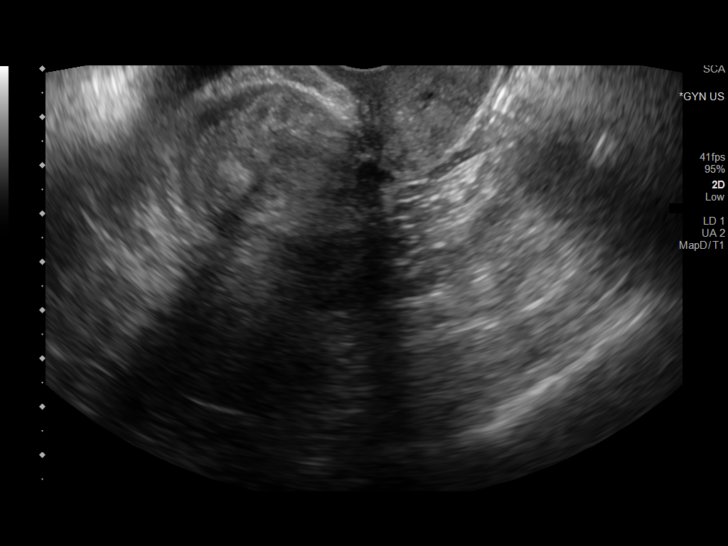
[im 35/49]
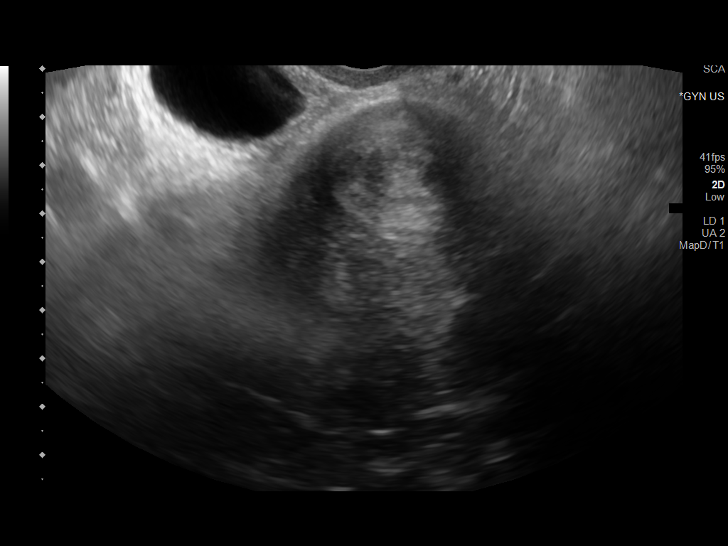
[im 40/49]
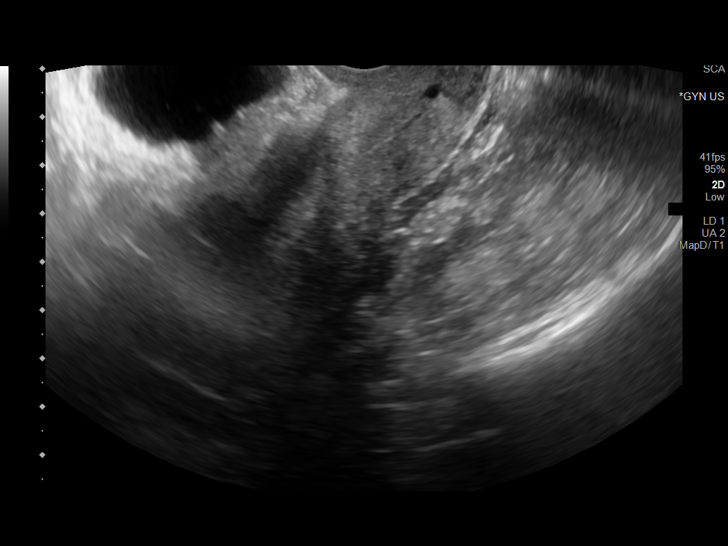
[im 44/49]
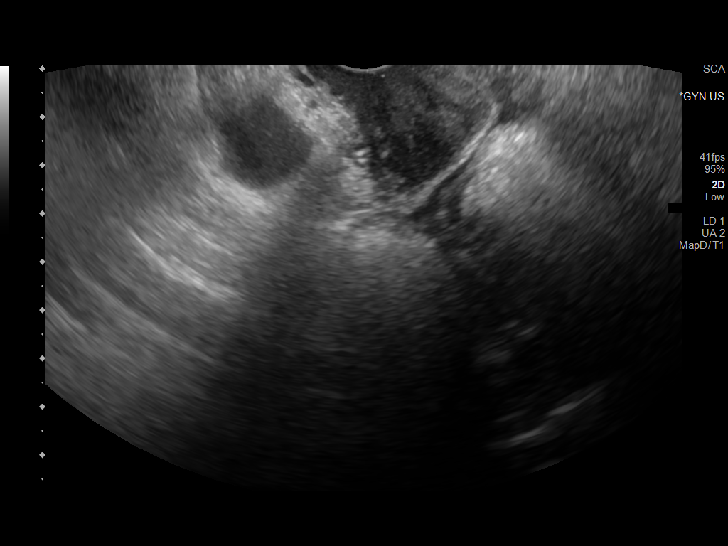
[im 49/49]
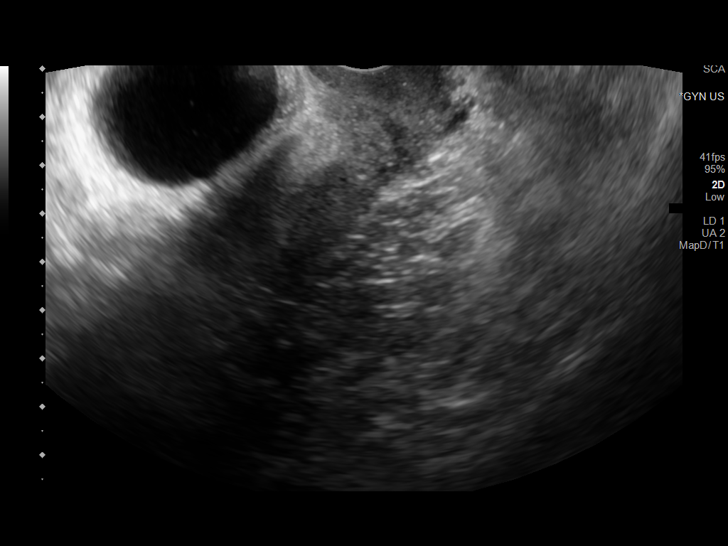

[Series 2: us pelvis complete with transvaginal · 1 of 4 slices shown (2 of 2)]
[im 4/4]
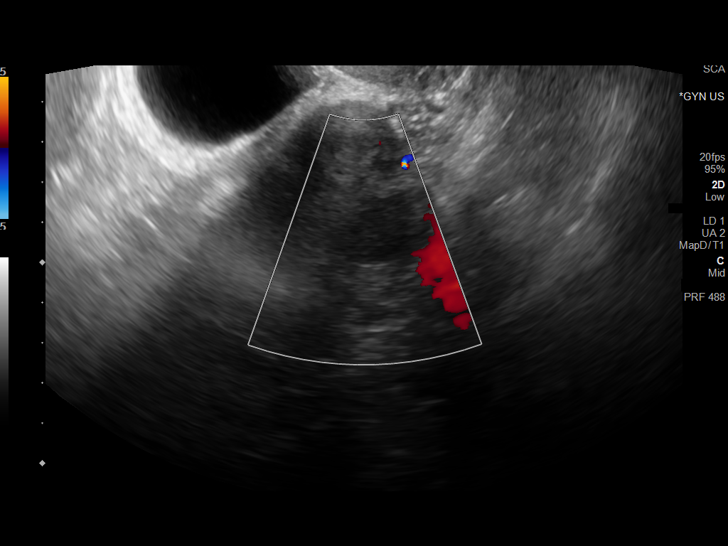

[13 of 25 positions shown; findings below may reference images not displayed]

FINDINGS: Uterus

Measurements: 8.3 x 5.0 x 4.7 cm = volume: 101 mL. Anteverted.
Exophytic leiomyoma at anterior upper uterus 3.7 x 3.1 x 4.4 cm,
cannot exclude submucosal extension. Additional posterior leiomyoma
slightly to LEFT 2.7 x 1.9 x 2.8 cm, subserosal.

Endometrium

Thickness: 2 mm and lower uterine segment. Endometrial complex is
obscured at the mid to upper uterine segments by the large anterior
wall mass and smaller posterior mass. No endometrial fluid

Right ovary

Not visualized, likely obscured by bowel

Left ovary

Not visualized, likely obscured by bowel

Other findings

No free pelvic fluid.  No adnexal masses.
IMPRESSION: Two uterine leiomyomata as above, the larger of which at the upper
uterus could potentially extend submucosal.

Nonvisualization of ovaries.

Visualized endometrial complex at the lower uterus is unremarkable.

Endometrial complex at the mid to upper uterine segment is obscured.

In the setting of postmenopausal bleeding and failure to adequately
visualize the endometrial complex, endometrial sampling is indicated
to exclude carcinoma. If results are benign, sonohysterogram should
be considered for focal lesion work-up. (Ref: Radiological
Reasoning: Algorithmic Workup of Abnormal Vaginal Bleeding with
Endovaginal Sonography and Sonohysterography. AJR [id number]; 191:S68-73)

## 2023-03-13 ENCOUNTER — Encounter: Payer: Self-pay | Admitting: Student

## 2023-03-13 ENCOUNTER — Other Ambulatory Visit: Payer: Self-pay | Admitting: Student

## 2023-03-13 ENCOUNTER — Ambulatory Visit (INDEPENDENT_AMBULATORY_CARE_PROVIDER_SITE_OTHER): Payer: 59 | Admitting: Student

## 2023-03-13 VITALS — BP 138/65 | HR 65 | Temp 98.6°F | Ht 66.0 in | Wt 310.8 lb

## 2023-03-13 DIAGNOSIS — I10 Essential (primary) hypertension: Secondary | ICD-10-CM

## 2023-03-13 DIAGNOSIS — R197 Diarrhea, unspecified: Secondary | ICD-10-CM

## 2023-03-13 DIAGNOSIS — E785 Hyperlipidemia, unspecified: Secondary | ICD-10-CM

## 2023-03-13 DIAGNOSIS — C541 Malignant neoplasm of endometrium: Secondary | ICD-10-CM | POA: Diagnosis not present

## 2023-03-13 DIAGNOSIS — E1169 Type 2 diabetes mellitus with other specified complication: Secondary | ICD-10-CM | POA: Diagnosis not present

## 2023-03-13 DIAGNOSIS — E669 Obesity, unspecified: Secondary | ICD-10-CM | POA: Diagnosis not present

## 2023-03-13 DIAGNOSIS — R110 Nausea: Secondary | ICD-10-CM

## 2023-03-13 LAB — POC SOFIA 2 FLU + SARS ANTIGEN FIA
Influenza A, POC: NEGATIVE
Influenza B, POC: NEGATIVE
SARS Coronavirus 2 Ag: NEGATIVE

## 2023-03-13 LAB — POCT GLYCOSYLATED HEMOGLOBIN (HGB A1C): HbA1c, POC (controlled diabetic range): 6.1 % (ref 0.0–7.0)

## 2023-03-13 MED ORDER — SEMAGLUTIDE(0.25 OR 0.5MG/DOS) 2 MG/1.5ML ~~LOC~~ SOPN: 0.2500 mg | PEN_INJECTOR | SUBCUTANEOUS | 3 refills | Status: DC

## 2023-03-13 MED ORDER — ONDANSETRON 4 MG PO TBDP: 4.0000 mg | ORAL_TABLET | Freq: Three times a day (TID) | ORAL | 0 refills | Status: DC | PRN

## 2023-03-13 NOTE — Assessment & Plan Note (Signed)
Appears to have a viral constellation of symptoms with sick contacts.  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  Will continue with this today and discussed symptomatic treatment.  Provided the patient with Zofran for nausea, few tabs to get her through the next couple of days.  Additionally can use over-the-counter analgesics, remain hydrated with Gatorade/Pedialyte/water.  Patient verbalized understanding.  Seems that she is recovering well from the illness despite multiple comorbidities.

## 2023-03-13 NOTE — Assessment & Plan Note (Signed)
BP: 138/65 today. Well controlled. Continue to work on healthy dietary habits and exercise. Follow up in 1 mth.   Medication regimen: Amlodipine 10 mg, lisinopril, HCTZ 25 mg, metoprolol 50 mg.

## 2023-03-13 NOTE — Progress Notes (Deleted)
Subjective:  Patient ID: Sheila Carlson, female    DOB: May 30, 1961, 62 y.o.   MRN: [id number]  CC: New Patient  HPI:  Sheila Carlson is a very pleasant 62 y.o. female who presents today to establish care.  Hypertension: BP: 138/65 today. Home medications include: Amlodipine 10 mg, Lisinopril, HCTZ 25 mg, metoprolol 50 mg. She endorses taking these medications as prescribed. Does not check blood pressure at home. Most recent creatinine trend:  Lab Results  Component Value Date   CREATININE 0.67 12/20/2022   CREATININE 0.80 11/29/2022   CREATININE 0.78 09/01/2022   Patient has had a BMP in the past 1 year.  Type 2 Diabetes: Home medications include: none.  Tries to  endorse compliance. Home glucose monitoring is not performed.   Dietary changes: vegan, no meats.  She has tried taking a medication-phentermine. Lost about 50 lbs with this.    Endometrial Cancer: Has hormonal IUD and had regression of cancer     Most recent A1Cs:  Lab Results  Component Value Date   HGBA1C 6.5 (H) 11/29/2022   HGBA1C 6.6 (H) 09/01/2022   Last Microalbumin, LDL, Creatinine: Lab Results  Component Value Date   MICROALBUR 1.3 04/13/2020   LDLCALC 88 11/29/2022   CREATININE 0.67 12/20/2022    Patient {rwisisnot:[id number]} up to date on diabetic eye. Patient {rwisisnot:[id number]} up to date on diabetic foot exam.  HLD: Last LDL 88 three months ago   PMHx: Past Medical History:  Diagnosis Date   Allergy    Anemia    Anxiety    Cancer ([place])    endometrial   Cataract    COVID-19 04/2020   GERD (gastroesophageal reflux disease)    HEMATOCHEZIA 06/25/2007   Annotation: bright red blood on tissue once or twice a month when constipated.   Qualifier: Diagnosis of  By: Marshell Levan MD, Vicente Serene     History of low potassium    Hyperlipidemia 04/16/2020   Hypertension    Pre-diabetes     Surgical Hx: Past Surgical History:  Procedure Laterality Date   CESAREAN SECTION     DILATION AND CURETTAGE OF  UTERUS N/A 05/24/2021   Procedure: DILATATION AND CURETTAGE;  Surgeon: Everitt Amber, MD;  Location: [facility];  Service: Gynecology;  Laterality: N/A;   INTRAUTERINE DEVICE (IUD) INSERTION N/A 05/24/2021   Procedure: INTRAUTERINE DEVICE (IUD) INSERTION;MIRENA;  Surgeon: Everitt Amber, MD;  Location: [facility];  Service: Gynecology;  Laterality: N/A;    Family Hx: Family History  Problem Relation Age of Onset   Hypertension Mother    Diabetes Mother    High Cholesterol Mother    Alcoholism Mother    Obesity Mother    Alcohol abuse Father    Hypertension Father    Diabetes Father    Bipolar disorder Father    Alcoholism Father    Drug abuse Father    Colon cancer Sister    Asthma Sister    Colitis Sister        pt unsure   Breast cancer Neg Hx    Ovarian cancer Neg Hx    Endometrial cancer Neg Hx    Pancreatic cancer Neg Hx    Prostate cancer Neg Hx    Esophageal cancer Neg Hx    Rectal cancer Neg Hx    Stomach cancer Neg Hx     Social Hx: Current Social History   Who lives at home: *** 03/13/2023  Who would speak for you about health care matters: *** 03/13/2023  Transportation: *** 03/13/2023 Important Relationships & Pets: *** 03/13/2023  Current Stressors: Children 03/13/2023 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/2023 Religious / Personal Beliefs: *** 03/13/2023 Interests / Fun: *** 03/13/2023 Other: *** 03/13/2023   Medications:  Preventative Screening Colonoscopy:  Mammogram:  Pap test:  DEXA: year*** results *** Tetanus vaccine: year*** results *** Pneumonia vaccine: year*** results *** Shingles vaccine: year*** results *** Heart stress test: year*** results *** Echocardiogram: year*** results *** Xrays: year*** results *** CT/MRI: year*** results ***  Smoking status reviewed  ROS: pertinent noted in the HPI   Objective:  BP 138/65   Pulse 65   Temp 98.6 F (37 C)   Ht 5\' 6"  (1.676 m)   Wt (!) 310 lb 12.8 oz (141 kg)   LMP 02/14/2016   SpO2 96%   BMI 50.16  kg/m  Vitals and nursing note reviewed  General: NAD, pleasant, able to participate in exam HEENT: normocephalic, TM's visualized bilaterally, no scleral icterus or conjunctival pallor, no nasal discharge, moist mucous membranes, good dentition without erythema or discharge noted in posterior oropharynx Neck: supple, non-tender, without lymphadenopathy Cardiac: RRR, S1 S2 present. normal heart sounds, no murmurs. Respiratory: CTAB, normal effort, No wheezes, rales or rhonchi Abdomen: Normoactive bowel sounds, non-tender, non-distended, no hepatosplenomegaly Extremities: no edema or cyanosis. Skin: warm and dry, no rashes noted Neuro: alert, no obvious focal deficits Psych: Normal affect and mood  Assessment & Plan:  Diabetes mellitus type 2 in obese ([facility]) -     POCT glycosylated hemoglobin (Hb A1C)  No follow-ups on file. Erskine Emery, MD 03/13/2023, 8:47 AM PGY-2, [place]

## 2023-03-13 NOTE — Assessment & Plan Note (Signed)
Stable  - Last A1c:  Lab Results  Component Value Date   HGBA1C 6.1 03/13/2023   - Medications: None, diet controlled - Patient has had difficulty with her diet and has been unable to lose weight.  Given her significant obesity with BMI 50 in addition to diabetes, would recommend continuation with Ozempic for treatment of diabetes and weight loss benefit.  Provided the patient with optometrist in the area for eye exam.  Additionally, will perform foot exam on next visit.  Patient has no absolute contraindications, discussed that if the patient has difficulty with using the Ozempic, she can call and speak to Dr. Valentina Lucks by scheduling an appointment with him. Will discuss nutritionist intervention at next visit as well.

## 2023-03-13 NOTE — Assessment & Plan Note (Signed)
IUD placed 05/24/21, needs replaced at 5 years after placement in 05/27.

## 2023-03-13 NOTE — Assessment & Plan Note (Signed)
Patient takes Lipitor 20 mg.  Up-to-date lipid panel to be obtained on next visit.  Last LDL 88 3 months ago.

## 2023-03-13 NOTE — Patient Instructions (Addendum)
It was great to see you today! Thank you for choosing [facility] for your primary care. Sheila Carlson was seen for new patient appt.  Today we addressed: I will start you on a GLP1 called Ozempic--we will have you return in 1 month for assessment and lipid panel  If you have difficulty with the Ozempic, call the office to schedule appt with Dr. Valentina Lucks for discussion on how to use  I will call for covid and flu results  4.   I have sent in the Zofran for nausea, take as instructed      [facility] [address] Phone: [phone]  Open Monday- Saturday from 9 AM to 5 PM Ages 6 months and older Se habla Espaol [facility] at [facility] [place] Phone: [phone] Open Monday -Friday (by appointment only) Ages 76 and older No se habla Espaol   [facility] at [facility] [place] Phone: [phone] Open Monday-Saturday Ages 37 years and older Se habla Espaol  [facility] [place] [phone] [place]le[place] Ho[place], [place]  Phone: [phone] Open Monday-Friday Ages 5 years and older  [place]. Phone: [phone] Open Monday-Friday Ages 42 and older No se habla Espaol  [facility] [place] [address] Phone: [phone] Age 4 year old and older Open [place] at [facility] [place] Phone: [phone] Open Monday-Friday Ages 7 and older No se habla Espaol      If you haven't already, sign up for My Chart to have easy access to your labs results, and communication with your primary care physician.  We are checking some labs today. If they are abnormal, I will call you. If they are normal, I will send you a MyChart message (if it is active) or a letter in the mail. If you do not hear about your labs in the next 2  weeks, please call the office. I recommend that you always bring your medications to each appointment as this makes it easy to ensure you are on the correct medications and helps [place] not miss refills when you need them. Call the clinic at [phone] if your symptoms worsen or you have any concerns.  You should return to our clinic Return in about 4 weeks (around 04/10/2023) for Lipid panel, diabetes follow up . Please arrive 15 minutes before your appointment to ensure smooth check in process.  We appreciate your efforts in making this happen.  Thank you for allowing me to participate in your care, Erskine Emery, MD 03/13/2023, 9:05 AM PGY-2, [place]

## 2023-03-13 NOTE — Progress Notes (Signed)
Subjective:  Patient ID: Sheila Carlson, female    DOB: 01-30-1961, 62 y.o.   MRN: [id number]  CC: New Patient  HPI:  Sheila Carlson is a very pleasant 62 y.o. female who presents today to establish care.  Hypertension: BP: 138/65 today. Home medications include: Amlodipine 10 mg, Lisinopril, HCTZ 25 mg (with K supplementation), metoprolol 50 mg. She endorses taking these medications as prescribed. Does not check blood pressure at home.  Most recent creatinine trend:  Lab Results  Component Value Date   CREATININE 0.67 12/20/2022   CREATININE 0.80 11/29/2022   CREATININE 0.78 09/01/2022   Patient has had a BMP in the past 1 year.  Type 2 Diabetes  Obesity: Home medications include: None. Does endorse compliance with diet but does not see any true difference in weight.  Patient reports that most recently, she was trying to become a vegan.  She was seeing no improvement in her weight.  She has also seen a nutritionist with no benefit as it is very difficult for her to lose weight.  She notes that her husband is a Biomedical scientist and this causes problems with as well with eating. She also has seen healthy weight and wellness and has tried phentermine in the past.   Most recent A1Cs:  Lab Results  Component Value Date   HGBA1C 6.1 03/13/2023   HGBA1C 6.5 (H) 11/29/2022   Last Microalbumin, LDL, Creatinine: Lab Results  Component Value Date   MICROALBUR 1.3 04/13/2020   LDLCALC 88 11/29/2022   CREATININE 0.67 12/20/2022    Patient is not up to date on diabetic eye. Patient is not up to date on diabetic foot exam.  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   Qualifier: Diagnosis of  By: Marshell Levan MD, Vicente Serene     History of low potassium    Hyperlipidemia 04/16/2020   Hypertension    Pre-diabetes     Surgical Hx: Past Surgical History:  Procedure Laterality Date   CESAREAN SECTION     DILATION AND CURETTAGE OF UTERUS N/A 05/24/2021   Procedure: DILATATION AND CURETTAGE;  Surgeon: Everitt Amber, MD;  Location: [facility];  Service: Gynecology;  Laterality: N/A;   INTRAUTERINE DEVICE (IUD) INSERTION N/A 05/24/2021   Procedure: INTRAUTERINE DEVICE (IUD) INSERTION;MIRENA;  Surgeon: Everitt Amber, MD;  Location: [facility];  Service: Gynecology;  Laterality: N/A;    Family Hx: Family History  Problem Relation Age of Onset   Hypertension Mother    Diabetes Mother    High Cholesterol Mother    Alcoholism Mother    Obesity Mother    Alcohol abuse Father    Hypertension Father    Diabetes Father    Bipolar disorder Father    Alcoholism Father    Drug abuse  Father    Colon cancer Sister    Asthma Sister    Colitis Sister        pt unsure   Breast cancer Neg Hx    Ovarian cancer Neg Hx    Endometrial cancer Neg Hx    Pancreatic cancer Neg Hx    Prostate cancer Neg Hx    Esophageal cancer Neg Hx    Rectal cancer Neg Hx    Stomach cancer Neg Hx     Social Hx: Current Social History   Who lives at home: Patient with husband and children 03/13/2023  Transportation: Stable transportation 03/13/2023 Current Stressors: Children 03/13/2023 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/2023 Nonsmoker    Medications: See above. Updated.   Preventative Screening Colonoscopy: UTD--Due 2028 Mammogram: UTD--Due 05/2023 Pap test: due 05/12/23 DEXA: UTD and previously negative    Smoking status  reviewed  ROS: pertinent noted in the HPI   Objective:  BP 138/65   Pulse 65   Temp 98.6 F (37 C)   Ht 5\' 6"  (1.676 m)   Wt (!) 310 lb 12.8 oz (141 kg)   LMP 02/14/2016   SpO2 96%   BMI 50.16 kg/m  Vitals and nursing note reviewed  General: NAD, pleasant, able to participate in exam HEENT: normocephalic, no scleral icterus or conjunctival pallor, no nasal discharge, moist mucous membranes, good dentition without erythema or discharge noted in posterior oropharynx Neck: supple, non-tender, without lymphadenopathy Cardiac: RRR, S1 S2 present. normal heart sounds, no murmurs. Respiratory: CTAB, normal effort, No wheezes, rales or rhonchi Abdomen: Normoactive bowel sounds, non-tender, non-distended, no hepatosplenomegaly Skin: warm and dry, no rashes noted Neuro: alert, no obvious focal deficits Psych: Normal affect and mood  Assessment & Plan:  Diabetes mellitus type 2 in obese [facility]) Assessment & Plan: Stable  - Last A1c:  Lab Results  Component Value Date   HGBA1C 6.1 03/13/2023   - Medications: None, diet controlled - Patient has had difficulty with her diet and has been unable to lose weight.  Given her significant obesity with BMI 50 in addition to diabetes, would recommend continuation with Ozempic for treatment of diabetes and weight loss benefit.  Provided the patient with optometrist in the area for eye exam.  Additionally, will perform foot exam on next visit.  Patient has no absolute contraindications, discussed that if the patient has difficulty with using the Ozempic, she can call and speak to Dr. Valentina Lucks by scheduling an appointment with him. Will discuss nutritionist intervention at next visit as well.    Orders: -     POCT glycosylated hemoglobin (Hb A1C) -     Semaglutide(0.25 or 0.5MG /DOS); Inject 0.25 mg into the skin once a week. 0.25 mg once weekly for 4 weeks then increase to 0.5 mg weekly for at least 4 weeks,max 1 mg  Dispense: 3 mL; Refill: 3   Nausea   Diarrhea, unspecified type Assessment & Plan: Appears to have a viral constellation of symptoms with sick contacts.  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  Will continue with this today and discussed symptomatic treatment.  Provided the patient with Zofran for nausea, few tabs to get her through the next couple of days.  Additionally can use over-the-counter analgesics, remain hydrated with Gatorade/Pedialyte/water.  Patient verbalized understanding.  Seems that she is recovering well from the illness despite multiple comorbidities.   Orders: -     Ondansetron; Take 1 tablet (4 mg total)  by mouth every 8 (eight) hours as needed for nausea.  Dispense: 10 tablet; Refill: 0 -     POC SOFIA 2 FLU + SARS ANTIGEN FIA  Primary hypertension Assessment & Plan: BP: 138/65 today. Well controlled. Continue to work on healthy dietary habits and exercise. Follow up in 1 mth.   Medication regimen: Amlodipine 10 mg, lisinopril, HCTZ 25 mg, metoprolol 50 mg.    Hyperlipidemia associated with type 2 diabetes mellitus ([place]) Assessment & Plan: Patient takes Lipitor 20 mg.  Up-to-date lipid panel to be obtained on next visit.  Last LDL 88 3 months ago.   Endometrial cancer [facility]) Assessment & Plan: IUD placed 05/24/21, needs replaced at 5 years after placement in 05/27.     Return in about 4 weeks (around 04/10/2023) for Lipid panel, diabetes follow up . Erskine Emery, MD 03/13/2023, 10:25 AM PGY-2, [place]

## 2023-03-16 ENCOUNTER — Telehealth: Payer: Self-pay

## 2023-03-16 NOTE — Telephone Encounter (Signed)
A Prior Authorization was initiated for this patients TRULICITY through CoverMyMeds.   Key: [id number]

## 2023-03-19 NOTE — Telephone Encounter (Signed)
Prior Auth for patients medication TRULICITY approved by [facility] [employer] from 03/16/23 to 03/15/24.  CoverMyMeds Key: [id number] PA Case ID #: [id number]

## 2023-03-30 ENCOUNTER — Other Ambulatory Visit: Payer: Self-pay

## 2023-03-30 ENCOUNTER — Other Ambulatory Visit: Payer: Self-pay | Admitting: Student

## 2023-04-01 ENCOUNTER — Other Ambulatory Visit: Payer: Self-pay | Admitting: Family

## 2023-04-11 ENCOUNTER — Telehealth: Payer: Self-pay

## 2023-04-11 NOTE — Telephone Encounter (Signed)
Patient LVM on nurse line regarding Ozempic prescription. It appears that Ozempic was changed to Trulicity, PA completed and approved.   Attempted to return call to patient with update.   She did not answer, LVM asking patient to return call to office.   Veronda Prude, RN

## 2023-04-11 NOTE — Telephone Encounter (Signed)
Patient returns call to nurse line. Provided with update regarding medication.   No further questions at this time.   Veronda Prude, RN

## 2023-05-11 ENCOUNTER — Other Ambulatory Visit: Payer: Self-pay | Admitting: Family

## 2023-05-17 ENCOUNTER — Telehealth: Payer: Self-pay

## 2023-05-17 NOTE — Telephone Encounter (Signed)
A Prior Authorization was initiated for this patients TRULICITY through CoverMyMeds.   [facility] INSURANCE)  Key: Alvira Monday

## 2023-05-17 NOTE — Telephone Encounter (Signed)
Prior Auth for patients medication trulicity approved by [facility] MEDICAID from 05/03/23 to 05/16/24.  CoverMyMeds KeyAlvira Monday PA Case ID #: [id number]

## 2023-05-21 ENCOUNTER — Other Ambulatory Visit: Payer: Self-pay | Admitting: Family

## 2023-07-03 ENCOUNTER — Other Ambulatory Visit: Payer: Self-pay

## 2023-07-03 ENCOUNTER — Encounter: Payer: Self-pay | Admitting: Student

## 2023-07-03 ENCOUNTER — Ambulatory Visit (INDEPENDENT_AMBULATORY_CARE_PROVIDER_SITE_OTHER): Payer: 59 | Admitting: Student

## 2023-07-03 VITALS — BP 130/84 | HR 63 | Ht 66.0 in | Wt 304.4 lb

## 2023-07-03 DIAGNOSIS — I10 Essential (primary) hypertension: Secondary | ICD-10-CM | POA: Diagnosis not present

## 2023-07-03 DIAGNOSIS — Z1231 Encounter for screening mammogram for malignant neoplasm of breast: Secondary | ICD-10-CM

## 2023-07-03 DIAGNOSIS — Z0000 Encounter for general adult medical examination without abnormal findings: Secondary | ICD-10-CM

## 2023-07-03 DIAGNOSIS — E669 Obesity, unspecified: Secondary | ICD-10-CM | POA: Diagnosis not present

## 2023-07-03 DIAGNOSIS — E1169 Type 2 diabetes mellitus with other specified complication: Secondary | ICD-10-CM | POA: Diagnosis not present

## 2023-07-03 LAB — POCT GLYCOSYLATED HEMOGLOBIN (HGB A1C): HbA1c, POC (controlled diabetic range): 5.7 % (ref 0.0–7.0)

## 2023-07-03 MED ORDER — TRULICITY 0.75 MG/0.5ML ~~LOC~~ SOAJ: 1.5000 mg | SUBCUTANEOUS | 7 refills | Status: DC

## 2023-07-03 NOTE — Progress Notes (Signed)
  SUBJECTIVE:   CHIEF COMPLAINT / HPI:   Type 2 Diabetes: Home medications include: Trulicity weekly. Does endorse compliance.  Most recent A1Cs:  Lab Results  Component Value Date   HGBA1C 5.7 07/03/2023   HGBA1C 6.1 03/13/2023   Last Microalbumin, LDL, Creatinine: Lab Results  Component Value Date   MICROALBUR 1.3 04/13/2020   LDLCALC 88 11/29/2022   CREATININE 0.67 12/20/2022    She reports that she feels as if the Trulicity is not helping her lose weight.  She has tried altering her diet but is not exercising currently. Wants to continue with aquatic exercise but has not started.   Patient is not up to date on diabetic eye. Patient is not up to date on diabetic foot exam.  Hypertension: BP: 130/84 today. Home medications include: Amlodipine 10 mg, Lisinopril, HCTZ 25 mg (with K supplementation), metoprolol 50 mg. She endorses taking these medications as prescribed. Does check blood pressure at home. Most recent creatinine trend:  Lab Results  Component Value Date   CREATININE 0.67 12/20/2022   CREATININE 0.80 11/29/2022   CREATININE 0.78 09/01/2022   Patient has had a BMP in the past 1 year.  Last pap was 05/11/20--nml results   PERTINENT  PMH / PSH: HLD DM2 H/o endometrial cancer with Mirena IUD  HTN   Patient Care Team: Alfredo Martinez, MD as PCP - General (Family Medicine) OBJECTIVE:  BP 130/84   Pulse 63   Ht 5\' 6"  (1.676 m)   Wt (!) 304 lb 6.4 oz (138.1 kg)   LMP 02/14/2016   SpO2 100%   BMI 49.13 kg/m  Physical Exam  General: Alert and oriented in no apparent distress Heart: Regular rate and rhythm with no murmurs appreciated Lungs: normal WOB Abdomen: no abdominal pain Skin: Warm and dry Extremities: No lower extremity edema  Diabetic Foot Exam - Simple   Simple Foot Form Visual Inspection No deformities, no ulcerations, no other skin breakdown bilaterally: Yes Sensation Testing Intact to touch and monofilament testing bilaterally:  Yes Pulse Check Posterior Tibialis and Dorsalis pulse intact bilaterally: Yes Comments     ASSESSMENT/PLAN:  Primary hypertension Assessment & Plan: BP: 130/84 today. Well controlled. Continue to work on healthy dietary habits and exercise. Follow up in 3 mths.   Medication regimen: Amlodipine 10 mg, Lisinopril, HCTZ 25 mg (with K supplementation), metoprolol 50 mg    Preventative health care Assessment & Plan: No need for pap at present, HPV negative-normal pap on last check. Endometrial cancer, regression with Mirena. Repeat pap in 2 years (5 years from previous) and then will not need anymore due to age unless otherwise specified. Mammogram due and ordered.   Type 2 diabetes mellitus with obesity (HCC) Assessment & Plan: Increase trulicity, discussed that this increase gradually if tolerated will likely help in weight loss  Discussed exercise indoors at the present time given high temps outside.  On statin Already referred for eye exam on last visit Foot exam performed today  Orders: -     POCT glycosylated hemoglobin (Hb A1C) -     Trulicity; Inject 1.5 mg into the skin once a week.  Dispense: 0.5 mL; Refill: 7  Encounter for screening mammogram for malignant neoplasm of breast -     3D Screening Mammogram, Left and Right; Future   Return in about 3 months (around 10/03/2023). Alfredo Martinez, MD 07/03/2023, 2:28 PM PGY-2, [facility]

## 2023-07-03 NOTE — Assessment & Plan Note (Signed)
No need for pap at present, HPV negative-normal pap on last check. Endometrial cancer, regression with Mirena. Repeat pap in 2 years (5 years from previous) and then will not need anymore due to age unless otherwise specified. Mammogram due and ordered.

## 2023-07-03 NOTE — Assessment & Plan Note (Addendum)
Increase trulicity, discussed that this increase gradually if tolerated will likely help in weight loss  Discussed exercise indoors at the present time given high temps outside.  On statin Already referred for eye exam on last visit Foot exam performed today

## 2023-07-03 NOTE — Patient Instructions (Addendum)
It was great to see you today! Thank you for choosing [facility] for your primary care. Sheila Carlson was seen for follow up.  Today we addressed: I have increased your trulicity to 1.5 mg--call in 1 month to increase again  You need a mammogram to prevent breast cancer.  Please schedule an appointment.  You can call [phone].      If you haven't already, sign up for My Chart to have easy access to your labs results, and communication with your primary care physician.  We are checking some labs today. If they are abnormal, I will call you. If they are normal, I will send you a MyChart message (if it is active) or a letter in the mail. If you do not hear about your labs in the next 2 weeks, please call the office. I recommend that you always bring your medications to each appointment as this makes it easy to ensure you are on the correct medications and helps [place] not miss refills when you need them. Call the clinic at [phone] if your symptoms worsen or you have any concerns.  You should return to our clinic Return in about 3 months (around 10/03/2023). Please arrive 15 minutes before your appointment to ensure smooth check in process.  We appreciate your efforts in making this happen.  Thank you for allowing me to participate in your care, Alfredo Martinez, MD 07/03/2023, 2:06 PM PGY-3, [facility]

## 2023-07-03 NOTE — Assessment & Plan Note (Signed)
BP: 130/84 today. Well controlled. Continue to work on healthy dietary habits and exercise. Follow up in 3 mths.   Medication regimen: Amlodipine 10 mg, Lisinopril, HCTZ 25 mg (with K supplementation), metoprolol 50 mg

## 2023-07-08 ENCOUNTER — Other Ambulatory Visit: Payer: Self-pay | Admitting: Student

## 2023-07-08 ENCOUNTER — Other Ambulatory Visit: Payer: Self-pay | Admitting: Family

## 2023-07-08 DIAGNOSIS — R110 Nausea: Secondary | ICD-10-CM

## 2023-07-10 ENCOUNTER — Other Ambulatory Visit: Payer: Self-pay | Admitting: Student

## 2023-07-19 ENCOUNTER — Other Ambulatory Visit: Payer: Self-pay | Admitting: Student

## 2023-07-19 DIAGNOSIS — E669 Obesity, unspecified: Secondary | ICD-10-CM

## 2023-07-24 ENCOUNTER — Ambulatory Visit
Admission: RE | Admit: 2023-07-24 | Discharge: 2023-07-24 | Disposition: A | Discharge status: Home / Self Care | Payer: 59 | Source: Ambulatory Visit | Attending: Family Medicine | Admitting: Family Medicine

## 2023-07-24 DIAGNOSIS — Z1231 Encounter for screening mammogram for malignant neoplasm of breast: Secondary | ICD-10-CM

## 2023-08-04 ENCOUNTER — Other Ambulatory Visit: Payer: Self-pay | Admitting: Student

## 2023-08-05 ENCOUNTER — Other Ambulatory Visit: Payer: Self-pay | Admitting: Student

## 2023-09-19 ENCOUNTER — Encounter: Payer: Self-pay | Admitting: Family Medicine

## 2023-09-19 ENCOUNTER — Other Ambulatory Visit: Payer: Self-pay

## 2023-09-19 ENCOUNTER — Ambulatory Visit (INDEPENDENT_AMBULATORY_CARE_PROVIDER_SITE_OTHER): Payer: 59 | Admitting: Family Medicine

## 2023-09-19 VITALS — BP 127/64 | HR 67 | Ht 66.0 in | Wt 309.8 lb

## 2023-09-19 DIAGNOSIS — M79604 Pain in right leg: Secondary | ICD-10-CM | POA: Diagnosis not present

## 2023-09-19 NOTE — Progress Notes (Signed)
    SUBJECTIVE:   CHIEF COMPLAINT / HPI:   R leg Pain Three weeks ago sat down in car seat and felt like she twisted something.  No fall or direct blow.  Using ice and heat but not improving. Able to walk and function but just hurts.  No prior injuries to that leg.  No other joints are painful.   PERTINENT  PMH / PSH: Hypertension, endometrial cancer, Diabetes (increased trulicity in July)   OBJECTIVE:   BP 127/64   Pulse 67   Ht 5\' 6"  (1.676 m)   Wt (!) 309 lb 12.8 oz (140.5 kg)   LMP 02/14/2016   SpO2 100%   BMI 50.00 kg/m   R Leg Able to fully extend and flex R leg as far as her L but is painful  No obvious effusion compared to L Exam limited but no meniscus or tendon abnormality detected Thessaly's is negative  Pain is localized to lateral upper tibia below knee without palpable deformity Mobility:able to get up and down from exam table without assistance o but with mild pain R ankle and hip exam normal  ASSESSMENT/PLAN:   Right leg pain Assessment & Plan: Most consistent with tendonitis or bursitis.  No signs of internal R knee derangement.  Recommend continue icing and activity and add tylenol and ibuprofen as needed.  If not improving to contact us       Patient Instructions  Good to see you today - Thank you for coming in  Things we discussed today:  R knee strain - continue the ice when in hurts - keep active - Use tylenol 2 tabs three times a day - If not helping enough you can take ibuprofen 2-3 tabs three times a day - if not some better in 3-4 weeks or if getting worse then call me and we will get an xray  Trulicity - check with your insurance about an alternative   Carney Living, MD [facility]

## 2023-09-19 NOTE — Assessment & Plan Note (Signed)
Most consistent with tendonitis or bursitis.  No signs of internal R knee derangement.  Recommend continue icing and activity and add tylenol and ibuprofen as needed.  If not improving to contact us

## 2023-09-19 NOTE — Patient Instructions (Signed)
Good to see you today - Thank you for coming in  Things we discussed today:  R knee strain - continue the ice when in hurts - keep active - Use tylenol 2 tabs three times a day - If not helping enough you can take ibuprofen 2-3 tabs three times a day - if not some better in 3-4 weeks or if getting worse then call me and we will get an xray  Trulicity - check with your insurance about an alternative

## 2023-10-01 ENCOUNTER — Other Ambulatory Visit: Payer: Self-pay | Admitting: Family

## 2023-10-01 NOTE — Telephone Encounter (Signed)
Needs repeat lab before next refill

## 2023-10-03 DIAGNOSIS — Z008 Encounter for other general examination: Secondary | ICD-10-CM | POA: Diagnosis not present

## 2023-10-07 ENCOUNTER — Encounter: Payer: Self-pay | Admitting: Student

## 2023-10-27 ENCOUNTER — Other Ambulatory Visit: Payer: Self-pay | Admitting: Student

## 2023-10-29 ENCOUNTER — Other Ambulatory Visit (HOSPITAL_COMMUNITY): Payer: Self-pay

## 2023-11-21 ENCOUNTER — Ambulatory Visit (INDEPENDENT_AMBULATORY_CARE_PROVIDER_SITE_OTHER): Payer: 59 | Admitting: Family Medicine

## 2023-11-21 ENCOUNTER — Encounter: Payer: Self-pay | Admitting: Family Medicine

## 2023-11-21 VITALS — BP 125/81 | HR 62 | Ht 66.0 in | Wt 314.1 lb

## 2023-11-21 DIAGNOSIS — E1169 Type 2 diabetes mellitus with other specified complication: Secondary | ICD-10-CM | POA: Diagnosis not present

## 2023-11-21 DIAGNOSIS — E669 Obesity, unspecified: Secondary | ICD-10-CM

## 2023-11-21 DIAGNOSIS — H6121 Impacted cerumen, right ear: Secondary | ICD-10-CM | POA: Diagnosis not present

## 2023-11-21 DIAGNOSIS — E876 Hypokalemia: Secondary | ICD-10-CM

## 2023-11-21 DIAGNOSIS — Z7985 Long-term (current) use of injectable non-insulin antidiabetic drugs: Secondary | ICD-10-CM | POA: Diagnosis not present

## 2023-11-21 LAB — POCT GLYCOSYLATED HEMOGLOBIN (HGB A1C): HbA1c, POC (controlled diabetic range): 6.1 % (ref 0.0–7.0)

## 2023-11-21 MED ORDER — POTASSIUM CHLORIDE CRYS ER 20 MEQ PO TBCR: 20.0000 meq | EXTENDED_RELEASE_TABLET | Freq: Every day | ORAL | 1 refills | Status: AC

## 2023-11-21 MED ORDER — TRULICITY 3 MG/0.5ML ~~LOC~~ SOAJ: 3.0000 mg | SUBCUTANEOUS | 1 refills | Status: AC

## 2023-11-21 NOTE — Progress Notes (Signed)
    SUBJECTIVE:   CHIEF COMPLAINT / HPI:   T2DM -Current medication regimen: Trulicity 1 provide milligrams weekly -Denies polyuria, polydipsia, abdominal pain, chest pain, shortness of breath -Does not feel that she has achieved weight loss Trulicity yet  Lab Results  Component Value Date   HGBA1C 6.1 11/21/2023   HGBA1C 5.7 07/03/2023   HGBA1C 6.1 03/13/2023    History of hypokalemia - requesting refill on K supplementation   Ear fullness x a few days Primarily R ear - popping and fullness sensation No hearing loss, pain, fever, drainage/bleeding Has not tried any irrigation or ear drops   PERTINENT  PMH / PSH: Hypertension, diabetes, obesity  OBJECTIVE:   BP 125/81   Pulse 62   Ht 5\' 6"  (1.676 m)   Wt (!) 314 lb 2 oz (142.5 kg)   LMP 02/14/2016   SpO2 100%   BMI 50.70 kg/m   General: NAD, pleasant, able to participate in exam HEENT: R ear with cerumen impaction, L ear unremarkable. Ears are nontender with no visible swelling or discoloration. Persistent impaction after irrigation attempt Cardiac: RRR, no murmurs auscultated Respiratory: CTAB, normal WOB Abdomen: soft, non-tender, non-distended, normoactive bowel sounds Extremities: warm and well perfused, no edema or cyanosis Skin: warm and dry, no rashes noted Neuro: alert, no obvious focal deficits, speech normal Psych: Normal affect and mood  ASSESSMENT/PLAN:   Assessment & Plan Type 2 diabetes mellitus with obesity (HCC) A1c 6.1, well controlled, but has not achieved weight loss goals on current dose of Trulicity.  Increase Trulicity to 3 mg weekly and follow-up in 4 weeks for titration Hypokalemia History of hypokalemia on potassium supplementation, recheck BMP today as this has not been done in about a year.  She has been out of it for a couple of weeks now, so if potassium is normal on BMP will likely stop potassium supplementation given that her lisinopril should also be elevating her  potassium Impacted cerumen of right ear Right ear irrigation completed in clinic today, unsuccessful, advised use of Debrox drops and provided return precautions for worsening symptoms. No red flag symptoms or exam findings. Consider ENT referral if no improvement   Vonna Drafts, MD [facility]

## 2023-11-21 NOTE — Patient Instructions (Addendum)
I have increased your trulicity to 3mg  weekly. Please let us know if you have side effects from the increased dose.   I will let you know if results from today are abnormal  You can try DeBrox ear drops over the counter for earwax in the future

## 2023-11-21 NOTE — Assessment & Plan Note (Signed)
A1c 6.1, well controlled, but has not achieved weight loss goals on current dose of Trulicity.  Increase Trulicity to 3 mg weekly and follow-up in 4 weeks for titration

## 2023-11-22 LAB — BASIC METABOLIC PANEL
BUN/Creatinine Ratio: 18 (ref 12–28)
BUN: 15 mg/dL (ref 8–27)
CO2: 26 mmol/L (ref 20–29)
Calcium: 9.7 mg/dL (ref 8.7–10.3)
Chloride: 101 mmol/L (ref 96–106)
Creatinine, Ser: 0.83 mg/dL (ref 0.57–1.00)
Glucose: 113 mg/dL — ABNORMAL HIGH (ref 70–99)
Potassium: 3.5 mmol/L (ref 3.5–5.2)
Sodium: 143 mmol/L (ref 134–144)
eGFR: 79 mL/min/{1.73_m2} (ref >59)

## 2023-11-23 LAB — MICROALBUMIN / CREATININE URINE RATIO
Creatinine, Urine: 230.5 mg/dL
Microalb/Creat Ratio: 5 mg/g{creat} (ref 0–29)
Microalbumin, Urine: 10.7 ug/mL

## 2023-12-13 LAB — MOLECULAR PATHOLOGY

## 2023-12-14 ENCOUNTER — Ambulatory Visit (INDEPENDENT_AMBULATORY_CARE_PROVIDER_SITE_OTHER): Payer: 59 | Admitting: Family Medicine

## 2023-12-14 VITALS — BP 139/83 | HR 62 | Ht 66.0 in | Wt 315.4 lb

## 2023-12-14 DIAGNOSIS — H6992 Unspecified Eustachian tube disorder, left ear: Secondary | ICD-10-CM | POA: Diagnosis not present

## 2023-12-14 NOTE — Progress Notes (Signed)
    SUBJECTIVE:   CHIEF COMPLAINT / HPI: left ear pain  Seen by Dr. Barb Merino on 11/27. Unsuccessful ear irrigation, counseled on Debrox drops.   Still having sensation of decreased hearing on left side. Has been using Debrox and did notice some drainage with this. Has had a stuffy nose intermittently. No cough, fevers. Has not had issues hearing but states her left ear feels muffled. No dizziness, tinnitus, vomiting.  PERTINENT  PMH / PSH: Hx of Endometrial Cancer s/p D&C, T2DM, HTN, HLD  OBJECTIVE:   BP 139/83   Pulse 62   Ht 5\' 6"  (1.676 m)   Wt (!) 315 lb 6.4 oz (143.1 kg)   LMP 02/14/2016   SpO2 100%   BMI 50.91 kg/m   General: NAD, well appearing Neuro: A&O Respiratory: normal WOB on RA Extremities: Moving all 4 extremities equally HEENT: bilateral Tms visible, clear cone of light, no erythema or obvious effusion, hearing intact on both sides grossly  ASSESSMENT/PLAN:   Assessment & Plan Eustachian tube dysfunction, left Greatly improved ear exam today from previous cerumen impaction.  Suspect that symptoms of not resolved due to eustachian tube dysfunction in the setting of cold weather and allergies.  She reassuringly has no red flag symptoms to suggest inner ear pathology versus intracranial pathology.  If not improving with over-the-counter medications such as Flonase and Zyrtec which I have discussed in detail in the AVS will refer to ENT.  Patient agreeable to plan.  Return if symptoms worsen or fail to improve.  Celine Mans, MD [facility]

## 2023-12-14 NOTE — Assessment & Plan Note (Signed)
Greatly improved ear exam today from previous cerumen impaction.  Suspect that symptoms of not resolved due to eustachian tube dysfunction in the setting of cold weather and allergies.  She reassuringly has no red flag symptoms to suggest inner ear pathology versus intracranial pathology.  If not improving with over-the-counter medications such as Flonase and Zyrtec which I have discussed in detail in the AVS will refer to ENT.  Patient agreeable to plan.

## 2023-12-14 NOTE — Patient Instructions (Addendum)
It was great to see you! Thank you for allowing me to participate in your care!  Our plans for today:  - You may use Flonase (once spray daily before bed in each nostril) and Zyrtec once daily for the next 2 weeks. I expect this to improve your feeling of your ear being clogged. - Please make a follow-up appointment in 2 weeks if not improving.   Please arrive 15 minutes PRIOR to your next scheduled appointment time! If you do not, this affects OTHER patients' care.  Take care and seek immediate care sooner if you develop any concerns.   Celine Mans, MD, PGY-2 [facility] 9:39 AM 12/14/2023  [facility]

## 2024-02-03 ENCOUNTER — Other Ambulatory Visit: Payer: Self-pay | Admitting: Student

## 2024-02-06 ENCOUNTER — Other Ambulatory Visit: Payer: Self-pay | Admitting: Student

## 2024-06-03 ENCOUNTER — Encounter: Payer: Self-pay | Admitting: *Deleted
